# Patient Record
Sex: Male | Born: 1975 | Race: White | Hispanic: No | Smoking: Current every day smoker
Health system: Southern US, Community
[De-identification: ages and names within clinical notes are randomized; demographics above are authoritative.]

## PROBLEM LIST (undated history)

## (undated) DIAGNOSIS — R51 Headache: Secondary | ICD-10-CM

## (undated) DIAGNOSIS — M545 Low back pain, unspecified: Secondary | ICD-10-CM

## (undated) DIAGNOSIS — F329 Major depressive disorder, single episode, unspecified: Secondary | ICD-10-CM

## (undated) DIAGNOSIS — F429 Obsessive-compulsive disorder, unspecified: Secondary | ICD-10-CM

## (undated) DIAGNOSIS — F32A Depression, unspecified: Secondary | ICD-10-CM

## (undated) DIAGNOSIS — F1010 Alcohol abuse, uncomplicated: Secondary | ICD-10-CM

## (undated) HISTORY — DX: Headache: R51

## (undated) HISTORY — DX: Low back pain: M54.5

## (undated) HISTORY — DX: Low back pain, unspecified: M54.50

## (undated) HISTORY — DX: Depression, unspecified: F32.A

## (undated) HISTORY — DX: Obsessive-compulsive disorder, unspecified: F42.9

## (undated) HISTORY — DX: Major depressive disorder, single episode, unspecified: F32.9

---

## 2010-06-08 ENCOUNTER — Ambulatory Visit: Payer: Self-pay | Admitting: Internal Medicine

## 2010-06-08 ENCOUNTER — Encounter: Payer: Self-pay | Admitting: Family Medicine

## 2010-06-08 DIAGNOSIS — F1220 Cannabis dependence, uncomplicated: Secondary | ICD-10-CM

## 2010-06-08 DIAGNOSIS — F1010 Alcohol abuse, uncomplicated: Secondary | ICD-10-CM | POA: Insufficient documentation

## 2010-06-08 DIAGNOSIS — E663 Overweight: Secondary | ICD-10-CM | POA: Insufficient documentation

## 2010-06-08 DIAGNOSIS — S335XXA Sprain of ligaments of lumbar spine, initial encounter: Secondary | ICD-10-CM

## 2010-06-08 DIAGNOSIS — F17200 Nicotine dependence, unspecified, uncomplicated: Secondary | ICD-10-CM

## 2010-06-14 LAB — CONVERTED CEMR LAB
ALT: 31 units/L (ref 0–53)
CO2: 25 meq/L (ref 19–32)
Calcium: 10.3 mg/dL (ref 8.4–10.5)
Chloride: 105 meq/L (ref 96–112)
Cholesterol: 213 mg/dL — ABNORMAL HIGH (ref 0–200)
Creatinine, Ser: 1.01 mg/dL (ref 0.40–1.50)
Total CHOL/HDL Ratio: 4.8

## 2010-06-27 ENCOUNTER — Ambulatory Visit: Payer: Self-pay | Admitting: Internal Medicine
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
## 2010-10-24 ENCOUNTER — Encounter: Payer: Self-pay | Admitting: Family Medicine

## 2010-10-24 ENCOUNTER — Ambulatory Visit (INDEPENDENT_AMBULATORY_CARE_PROVIDER_SITE_OTHER): Payer: Managed Care, Other (non HMO) | Admitting: Family Medicine

## 2010-10-24 VITALS — BP 120/84 | HR 72 | Wt 222.8 lb

## 2010-10-24 DIAGNOSIS — M542 Cervicalgia: Secondary | ICD-10-CM

## 2010-10-24 DIAGNOSIS — S335XXA Sprain of ligaments of lumbar spine, initial encounter: Secondary | ICD-10-CM

## 2010-10-24 MED ORDER — CYCLOBENZAPRINE HCL 10 MG PO TABS: 10.0000 mg | ORAL_TABLET | Freq: Three times a day (TID) | ORAL | Status: DC | PRN

## 2010-10-24 MED ORDER — NAPROXEN 500 MG PO TABS: 500.0000 mg | ORAL_TABLET | Freq: Two times a day (BID) | ORAL | Status: DC
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
## 2010-10-24 NOTE — Assessment & Plan Note (Signed)
New. Most consistent with trapezius spasm. Will try flexeril and naprosyn. Rotator cuff tendinitis is a possibility but unlikely a tear because no known injury.

## 2010-12-05 ENCOUNTER — Emergency Department: Payer: Self-pay | Admitting: Emergency Medicine

## 2011-03-01 ENCOUNTER — Ambulatory Visit (INDEPENDENT_AMBULATORY_CARE_PROVIDER_SITE_OTHER): Payer: BC Managed Care – PPO | Admitting: Psychology

## 2011-03-01 DIAGNOSIS — F331 Major depressive disorder, recurrent, moderate: Secondary | ICD-10-CM

## 2011-03-01 DIAGNOSIS — F1010 Alcohol abuse, uncomplicated: Secondary | ICD-10-CM

## 2011-03-02 ENCOUNTER — Ambulatory Visit (INDEPENDENT_AMBULATORY_CARE_PROVIDER_SITE_OTHER): Payer: BC Managed Care – PPO | Admitting: Family Medicine

## 2011-03-02 ENCOUNTER — Encounter: Payer: Self-pay | Admitting: Family Medicine

## 2011-03-02 VITALS — BP 118/80 | HR 88 | Temp 98.4°F | Wt 221.8 lb

## 2011-03-02 DIAGNOSIS — F17200 Nicotine dependence, unspecified, uncomplicated: Secondary | ICD-10-CM

## 2011-03-02 DIAGNOSIS — F1220 Cannabis dependence, uncomplicated: Secondary | ICD-10-CM

## 2011-03-02 DIAGNOSIS — G4700 Insomnia, unspecified: Secondary | ICD-10-CM | POA: Insufficient documentation

## 2011-03-02 DIAGNOSIS — IMO0001 Reserved for inherently not codable concepts without codable children: Secondary | ICD-10-CM

## 2011-03-02 DIAGNOSIS — R52 Pain, unspecified: Secondary | ICD-10-CM

## 2011-03-02 DIAGNOSIS — F1010 Alcohol abuse, uncomplicated: Secondary | ICD-10-CM

## 2011-03-02 DIAGNOSIS — M791 Myalgia, unspecified site: Secondary | ICD-10-CM

## 2011-03-02 LAB — BASIC METABOLIC PANEL
CO2: 28 mEq/L (ref 19–32)
Calcium: 9.1 mg/dL (ref 8.4–10.5)
Chloride: 102 mEq/L (ref 96–112)
Glucose, Bld: 100 mg/dL — ABNORMAL HIGH (ref 70–99)
Potassium: 4.3 mEq/L (ref 3.5–5.1)
Sodium: 137 mEq/L (ref 135–145)

## 2011-03-02 MED ORDER — CYCLOBENZAPRINE HCL 10 MG PO TABS: 10.0000 mg | ORAL_TABLET | Freq: Two times a day (BID) | ORAL | Status: DC | PRN

## 2011-03-02 MED ORDER — TRAZODONE HCL 50 MG PO TABS: 50.0000 mg | ORAL_TABLET | Freq: Every day | ORAL | Status: AC

## 2011-03-02 NOTE — Assessment & Plan Note (Signed)
Check CK and K to rule out revesible causes. Anticipate component of body getting used to absence EtOH, treat with flexeril as needed.

## 2011-03-02 NOTE — Progress Notes (Signed)
  Subjective:    Patient ID: Cameron Torres, male    DOB: [birth year], 35 y.o.   MRN: [id number]  HPI CC: body aches  H/o depression, OCD.  Since last time I saw pt, he and wife had been having marital issues.  Had been seeing psych at [facility].  As of last Wednesday, stopped heavy drinking.  Was drinking case of beer then states stopping all drinking suddenly.  Thinks body was used to having EtOH to be relaxed, now feeling body aches all over.   Stopped drinking last Wednesday (>1 wk ago).    Saw Dr. Dellia Cloud yesterday - recommended we see pt today for further eval of soreness..   Feels "whole body in knot", back sore, neck sore, upper arms and legs sore.  Each day seems to get a bit worse.  Interferring with daily activities.  Denies fevers/chills.  Mild shakiness.  Sleep impaired, not getting restful sleep.  No palpitations, HA, gi upset or nausea.  Mood improved off substances.  No recent falls or injury.  No weakness or numbness.  Has tried alleve for 2 days, didn't help with soreness.  To start going to AA mtgs  Smoking - still <1ppd. Stopped MJ as well.  Review of Systems Per HPI    Objective:   Physical Exam  Nursing note and vitals reviewed. Constitutional: He appears well-developed and well-nourished. No distress.  Cardiovascular: Normal rate, regular rhythm, normal heart sounds and intact distal pulses.   No murmur heard. Pulmonary/Chest: Effort normal and breath sounds normal. No respiratory distress. He has no wheezes. He has no rales.  Musculoskeletal: Normal range of motion. He exhibits no edema.       Right shoulder: He exhibits normal range of motion.       Left shoulder: He exhibits normal range of motion.       Cervical back: He exhibits normal range of motion.       FROM neck and shoulders. No midline spine tenderness throughout spine. Some soreness to palpation upper arms, upper legs.  Skin: Skin is warm and dry. No rash noted.  Psychiatric: He has a normal  mood and affect.          Assessment & Plan:

## 2011-03-02 NOTE — Patient Instructions (Signed)
Trazodone 50mg  nightly to help sleep.  May increase to 2 pills nightly (100mg ) after 1 wk if not noticing improvement. Return if not improved after 2-3 wks. For muscle pain - trial of flexeril. Don't take flexeril and trazodone together.

## 2011-03-02 NOTE — Assessment & Plan Note (Signed)
Reports has quit.

## 2011-03-02 NOTE — Assessment & Plan Note (Signed)
Off EtOH, MJ.  Having trouble achieving restful sleep, sleep maintenance and initiation insomnia.  Trial of trazodone temporary then wean off.

## 2011-03-02 NOTE — Assessment & Plan Note (Addendum)
Reports has quit. To start attending AA. Congratulated. States sees improvement in relationship with wife.

## 2011-03-02 NOTE — Assessment & Plan Note (Signed)
Encouraged cessation.

## 2011-03-15 ENCOUNTER — Ambulatory Visit (INDEPENDENT_AMBULATORY_CARE_PROVIDER_SITE_OTHER): Payer: BC Managed Care – PPO | Admitting: Psychology

## 2011-03-15 DIAGNOSIS — F1010 Alcohol abuse, uncomplicated: Secondary | ICD-10-CM

## 2011-04-04 ENCOUNTER — Ambulatory Visit (INDEPENDENT_AMBULATORY_CARE_PROVIDER_SITE_OTHER): Payer: BC Managed Care – PPO | Admitting: Psychology

## 2011-04-04 DIAGNOSIS — F1010 Alcohol abuse, uncomplicated: Secondary | ICD-10-CM

## 2011-04-12 ENCOUNTER — Ambulatory Visit: Payer: BC Managed Care – PPO | Admitting: Psychology

## 2011-08-16 ENCOUNTER — Ambulatory Visit (INDEPENDENT_AMBULATORY_CARE_PROVIDER_SITE_OTHER): Payer: BC Managed Care – PPO | Admitting: Psychology

## 2011-08-16 DIAGNOSIS — F1010 Alcohol abuse, uncomplicated: Secondary | ICD-10-CM

## 2011-08-22 ENCOUNTER — Encounter: Payer: Self-pay | Admitting: Family Medicine

## 2011-08-22 ENCOUNTER — Ambulatory Visit (INDEPENDENT_AMBULATORY_CARE_PROVIDER_SITE_OTHER): Payer: BC Managed Care – PPO | Admitting: Family Medicine

## 2011-08-22 DIAGNOSIS — J329 Chronic sinusitis, unspecified: Secondary | ICD-10-CM | POA: Insufficient documentation

## 2011-08-22 DIAGNOSIS — H00029 Hordeolum internum unspecified eye, unspecified eyelid: Secondary | ICD-10-CM

## 2011-08-22 DIAGNOSIS — H00022 Hordeolum internum right lower eyelid: Secondary | ICD-10-CM | POA: Insufficient documentation

## 2011-08-22 DIAGNOSIS — Z639 Problem related to primary support group, unspecified: Secondary | ICD-10-CM | POA: Insufficient documentation

## 2011-08-22 MED ORDER — AMOXICILLIN-POT CLAVULANATE 875-125 MG PO TABS: 1.0000 | ORAL_TABLET | Freq: Two times a day (BID) | ORAL | Status: AC

## 2011-08-22 NOTE — Assessment & Plan Note (Signed)
Anticipate small hordeolum. Treat with warm compresses, artificial tears to ensure eyes stay hydrated.

## 2011-08-22 NOTE — Patient Instructions (Signed)
You have a sinus infection. Take medicine as prescribed: augmentin twice daily for 10 days. Push fluids and plenty of rest. Nasal saline irrigation or neti pot to help drain sinuses. May use simple mucinex with plenty of fluid to help mobilize mucous. for eyes, use warm compresses as well as artificial tears (like hypotears or rephresh).  If not improving let me know. For mood - I do want to wait on letter from Dr. Dellia Cloud  If you haven't heard from [place] this week, call him to make sure he's sent it. Let us know if fever >101.5, trouble opening/closing mouth, difficulty swallowing, or worsening - you may need to be seen again.

## 2011-08-22 NOTE — Assessment & Plan Note (Signed)
Anticipate acute sinusitis, given duration will treat as bacterial cause with course of augmentin. See pt instructions for plan. Update [place] if sxs not improving as expected.

## 2011-08-22 NOTE — Assessment & Plan Note (Addendum)
I do want to review letter from Dr. Dellia Cloud prior to prescribing benzos given h/o EtOH and MJ use. Will await letter. Advised to call psych if has not heard from [place] regarding treatment plan. Briefly discussed benzo use.

## 2011-08-22 NOTE — Progress Notes (Signed)
  Subjective:    Patient ID: Cameron Torres, male    DOB: 17-Aug-1975, 36 y.o.   MRN: [id number]  HPI CC: URI sxs  Sick for last several weeks.   Left work early last week, slept all day.  Felt better one day but since progressively worse.  Nasal congestion, ST in am, coughing spells throughout day, productive of green sputum.  Better after hot shower.  + HA described as sinus pressure.  Endorsing chills at home.  Staying queasy and with mild diarrhea.  Taking nyquil, mucinex.    No fevers, abd pain, vomiting, body aches, ear pain, tooth pain.    Did have flu shot 05/2012.  Also noticed bump R lower eyelid.  Irritating to the eye.    Continues to see Dr. Dellia Cloud.  Last saw on Tuesday.  He was to send me a letter but I have not received.  Feels gets angry, frustrated easily.  May be going through life change - with wife.  Prior was using EtOH to deal with this, but has been abstinent for last 7-8 months.  Thinks has been recommended start benzo by psychology.  Smoking at home.  Down to 1/2 ppd.  Children sick recently.  No h/o asthma.  Review of Systems Per HPI    Objective:   Physical Exam  Nursing note and vitals reviewed. Constitutional: He appears well-developed and well-nourished. No distress.  HENT:  Head: Normocephalic and atraumatic.  Right Ear: Hearing, tympanic membrane, external ear and ear canal normal.  Left Ear: Hearing, tympanic membrane, external ear and ear canal normal.  Nose: No mucosal edema or rhinorrhea. Right sinus exhibits maxillary sinus tenderness. Right sinus exhibits no frontal sinus tenderness. Left sinus exhibits maxillary sinus tenderness. Left sinus exhibits no frontal sinus tenderness.  Mouth/Throat: Uvula is midline and mucous membranes are normal. Posterior oropharyngeal edema and posterior oropharyngeal erythema present. No oropharyngeal exudate or tonsillar abscesses.       Sinus pressure Erythematous uvula  Eyes: EOM are normal. Pupils are  equal, round, and reactive to light. Right conjunctiva is not injected. Right conjunctiva has no hemorrhage. Left conjunctiva is not injected. Left conjunctiva has no hemorrhage. No scleral icterus.       R lower eyelid with tiny inflammed granuloma Bilaterally erythematous palpebral conjunctiva  Neck: Normal range of motion. Neck supple. No thyromegaly present.  Cardiovascular: Normal rate, regular rhythm, normal heart sounds and intact distal pulses.   No murmur heard. Pulmonary/Chest: Effort normal and breath sounds normal. No respiratory distress. He has no wheezes. He has no rales.  Lymphadenopathy:    He has no cervical adenopathy.  Skin: Skin is warm and dry. No rash noted.  Psychiatric: He has a normal mood and affect. His behavior is normal. Judgment and thought content normal.       Calm, good judgement      Assessment & Plan:

## 2011-08-25 ENCOUNTER — Telehealth: Payer: Self-pay | Admitting: Family Medicine

## 2011-08-25 MED ORDER — LORAZEPAM 1 MG PO TABS: 1.0000 mg | ORAL_TABLET | Freq: Two times a day (BID) | ORAL | Status: DC | PRN

## 2011-08-25 NOTE — Telephone Encounter (Signed)
Received letter from Dr. Dellia Cloud, asked to scan. Tried to touch base with Cameron Torres, left message. May start medicine to help with anxiety - want to discuss prn benzo vs better long term management strategy of SSRI like citalopram.  If pt invested in benzo, may try but will need to closely monitor. rtc 1 mo as previously scheduled.

## 2011-08-25 NOTE — Telephone Encounter (Signed)
Will do prn benzo.  Called in.  Discussed if worsening to return.  Discussed if needing higher dose of this will need to switch to another treatment option (SSRI).

## 2011-08-25 NOTE — Telephone Encounter (Signed)
Spoke with patient. He said he prefers to not have to depend on a medication daily due to his history, however he is aware of the addiction potential of prn benzos. He said that he doesn't have to deal with things that make him angry everyday or every week, but when he does, he would like something to help diffuse before he gets angry. I advised that you would more than likely call him back after hours and he said the number listed for him is the best number to reach him.

## 2011-09-04 ENCOUNTER — Ambulatory Visit: Payer: BC Managed Care – PPO | Admitting: Psychology

## 2011-12-04 ENCOUNTER — Encounter: Payer: Self-pay | Admitting: Family Medicine

## 2011-12-04 ENCOUNTER — Encounter: Payer: Self-pay | Admitting: *Deleted

## 2011-12-04 ENCOUNTER — Ambulatory Visit (INDEPENDENT_AMBULATORY_CARE_PROVIDER_SITE_OTHER): Payer: BC Managed Care – PPO | Admitting: Family Medicine

## 2011-12-04 VITALS — BP 118/82 | HR 84 | Temp 98.5°F | Wt 222.2 lb

## 2011-12-04 DIAGNOSIS — L237 Allergic contact dermatitis due to plants, except food: Secondary | ICD-10-CM | POA: Insufficient documentation

## 2011-12-04 DIAGNOSIS — L255 Unspecified contact dermatitis due to plants, except food: Secondary | ICD-10-CM

## 2011-12-04 MED ORDER — CYCLOBENZAPRINE HCL 10 MG PO TABS: 10.0000 mg | ORAL_TABLET | Freq: Two times a day (BID) | ORAL | Status: DC | PRN

## 2011-12-04 MED ORDER — DEXAMETHASONE SODIUM PHOSPHATE 10 MG/ML IJ SOLN
10.0000 mg | Freq: Once | INTRAMUSCULAR | Status: AC
  Administered 2011-12-04: 10 mg via INTRAMUSCULAR

## 2011-12-04 MED ORDER — PREDNISONE 20 MG PO TABS: ORAL_TABLET | ORAL | Status: DC

## 2011-12-04 MED ORDER — LORAZEPAM 1 MG PO TABS: 1.0000 mg | ORAL_TABLET | Freq: Two times a day (BID) | ORAL | Status: AC | PRN

## 2011-12-04 NOTE — Progress Notes (Signed)
Addended by: Josph Macho A on: 12/04/2011 10:46 AM   Modules accepted: Orders

## 2011-12-04 NOTE — Assessment & Plan Note (Signed)
Contact dermatitis to presumed poison ivy exposure on Friday. Given rapid spread, treat aggressively with shot of dexamethasone 10mg  IM and then treat with 10 d taper of prednisone. Update [place] if not improved after this.

## 2011-12-04 NOTE — Progress Notes (Signed)
  Subjective:    Patient ID: Cameron Torres, male    DOB: Apr 10, 1976, 36 y.o.   MRN: [id number]  HPI CC: poison ivy  Friday trimming hedges, then that night noticed rash on left forearm. Since then spreading, now on forehead, abdomen, right hand as well.  Noticing hands starting to swell and stiff from inflammation.  Very itchy.  No fevers/chills.  No new lotions or detergents, soaps, shampoos.  Denies recent sunburn.  Has used ivarest for itching.  H/o bad reaction to poison ivy  Requests refill of lorazepam and flexeril.  Review of Systems Per HPI    Objective:   Physical Exam  Nursing note and vitals reviewed. Constitutional: He appears well-developed and well-nourished. No distress.  Skin: Skin is warm and dry. Rash noted.       Diffuse pruritic papular rash on forearms as well as hands, some digit swelling present as well. Forehead erythematous and diffuse faint papular rash.  Some spreading down face. Right anterior abdomen with patch of erythematous papules  Psychiatric: He has a normal mood and affect.       Assessment & Plan:

## 2011-12-04 NOTE — Patient Instructions (Signed)
Steroid shot today. Take prednisone course as directed. May use oatmeal bath for itching as well as continuing anti itch cream. Can also take claritin or zyrtec for itch. Let us know if not improving as expected.  Poison [employer] ivy is a inflammation of the skin (contact dermatitis) caused by touching the allergens on the leaves of the ivy plant following previous exposure to the plant. The rash usually appears 48 hours after exposure. The rash is usually bumps (papules) or blisters (vesicles) in a linear pattern. Depending on your own sensitivity, the rash may simply cause redness and itching, or it may also progress to blisters which may break open. These must be well cared for to prevent secondary bacterial (germ) infection, followed by scarring. Keep any open areas dry, clean, dressed, and covered with an antibacterial ointment if needed. The eyes may also get puffy. The puffiness is worst in the morning and gets better as the day progresses. This dermatitis usually heals without scarring, within 2 to 3 weeks without treatment. HOME CARE INSTRUCTIONS  Thoroughly wash with soap and water as soon as you have been exposed to poison ivy. You have about one half hour to remove the plant resin before it will cause the rash. This washing will destroy the oil or antigen on the skin that is causing, or will cause, the rash. Be sure to wash under your fingernails as any plant resin there will continue to spread the rash. Do not rub skin vigorously when washing affected area. Poison ivy cannot spread if no oil from the plant remains on your body. A rash that has progressed to weeping sores will not spread the rash unless you have not washed thoroughly. It is also important to wash any clothes you have been wearing as these may carry active allergens. The rash will return if you wear the unwashed clothing, even several days later. Avoidance of the plant in the future is the best measure. Poison ivy plant can be  recognized by the number of leaves. Generally, poison ivy has three leaves with flowering branches on a single stem. Diphenhydramine may be purchased over the counter and used as needed for itching. Do not drive with this medication if it makes you drowsy.Ask your caregiver about medication for children. SEEK MEDICAL CARE IF:  Open sores develop.   Redness spreads beyond area of rash.   You notice purulent (pus-like) discharge.   You have increased pain.   Other signs of infection develop (such as fever).  Document Released: 06/30/2000 Document Revised: 06/22/2011 Document Reviewed: 05/19/2009 [facility] Patient Information 2012 [place], [place].

## 2012-11-19 ENCOUNTER — Encounter: Payer: Self-pay | Admitting: Family Medicine

## 2012-11-19 ENCOUNTER — Ambulatory Visit (INDEPENDENT_AMBULATORY_CARE_PROVIDER_SITE_OTHER): Payer: BC Managed Care – PPO | Admitting: Family Medicine

## 2012-11-19 VITALS — BP 138/92 | HR 88 | Temp 98.4°F | Wt 216.2 lb

## 2012-11-19 DIAGNOSIS — M62838 Other muscle spasm: Secondary | ICD-10-CM | POA: Insufficient documentation

## 2012-11-19 MED ORDER — CYCLOBENZAPRINE HCL 10 MG PO TABS: 10.0000 mg | ORAL_TABLET | Freq: Two times a day (BID) | ORAL | Status: AC | PRN

## 2012-11-19 MED ORDER — NAPROXEN 500 MG PO TABS: ORAL_TABLET | ORAL | Status: AC

## 2012-11-19 NOTE — Progress Notes (Signed)
  Subjective:    Patient ID: Cameron Torres, male    DOB: September 23, 1975, 37 y.o.   MRN: [id number]  HPI CC: neck pain  1 wk h/o Left neck into left shoulderblade pain.  Tolerable, did not take meds for this.  Then today acutely worse - feels like ice pick to left neck muscles.  Left work early today 2/2 pain.  Has been working overtime.  Not strenuous job.  today has tried 2 aleve which did not help.  Denies inciting trauma, falls, injury.   Denies radiculopathy down arms.  Occasional paresthesias of left hand when pain it's worse.  Past Medical History  Diagnosis Date  . Depression   . OCD (obsessive compulsive disorder)   . Headache   . LBP (low back pain)     Review of Systems Per HPI    Objective:   Physical Exam  Nursing note and vitals reviewed. Constitutional: He appears well-developed and well-nourished. No distress.  Musculoskeletal:  Midline cervical spine tenderness around C6/7 ++ evident left trapezius mm tightness as well as significant tenderness to palpation along entire muscle. Left shoulder raised at rest, left shoulderblade elevated as well compared to right FROM at shoulders and neck albeit elicits pain  Neurological: He has normal strength. No sensory deficit.  Reflex Scores:      Bicep reflexes are 1+ on the right side and 1+ on the left side.      Brachioradialis reflexes are 1+ on the right side and 1+ on the left side. 5/5 strength BUE Grip strength intact       Assessment & Plan:

## 2012-11-19 NOTE — Assessment & Plan Note (Signed)
Severe on left, anticipate trapezius spasm. He does have midline cervical neck pain today, however no other red flags on exam, nontraumatic, nonfocal neuro exam. Treat for now with flexeril and naprosyn. Update [place] if sxs persist for steroid course vs further evaluation. Pt agrees with plan.

## 2012-11-19 NOTE — Patient Instructions (Addendum)
You have severe muscle spasm of left neck muscles (trapezius). Treat with stretching exercises, ice/heat (whichever soothes better), and naprosyn anti inflammatory and flexeril muscle relaxant. If not improving with this ,let me know for steroid course. Work note provided today.

## 2016-01-18 ENCOUNTER — Encounter: Payer: Self-pay | Admitting: Emergency Medicine

## 2016-01-18 ENCOUNTER — Emergency Department
Admission: EM | Admit: 2016-01-18 | Discharge: 2016-01-18 | Disposition: A | Discharge status: Home / Self Care | Payer: Medicaid Other | Attending: Emergency Medicine | Admitting: Emergency Medicine

## 2016-01-18 DIAGNOSIS — F17200 Nicotine dependence, unspecified, uncomplicated: Secondary | ICD-10-CM | POA: Diagnosis not present

## 2016-01-18 DIAGNOSIS — Y999 Unspecified external cause status: Secondary | ICD-10-CM | POA: Insufficient documentation

## 2016-01-18 DIAGNOSIS — Y92009 Unspecified place in unspecified non-institutional (private) residence as the place of occurrence of the external cause: Secondary | ICD-10-CM | POA: Diagnosis not present

## 2016-01-18 DIAGNOSIS — F329 Major depressive disorder, single episode, unspecified: Secondary | ICD-10-CM | POA: Insufficient documentation

## 2016-01-18 DIAGNOSIS — Y9389 Activity, other specified: Secondary | ICD-10-CM | POA: Diagnosis not present

## 2016-01-18 DIAGNOSIS — S61402A Unspecified open wound of left hand, initial encounter: Secondary | ICD-10-CM | POA: Insufficient documentation

## 2016-01-18 DIAGNOSIS — W268XXA Contact with other sharp object(s), not elsewhere classified, initial encounter: Secondary | ICD-10-CM | POA: Insufficient documentation

## 2016-01-18 DIAGNOSIS — Z791 Long term (current) use of non-steroidal anti-inflammatories (NSAID): Secondary | ICD-10-CM | POA: Diagnosis not present

## 2016-01-18 MED ORDER — LIDOCAINE-EPINEPHRINE (PF) 1 %-1:200000 IJ SOLN
10.0000 mL | Freq: Once | INTRAMUSCULAR | Status: DC
  Filled 2016-01-18: qty 30

## 2016-01-18 NOTE — ED Notes (Signed)
Pt from urgent care after trying to take a wart off his hand. He states that he used fingernail clippers, screwdriver (for cauterizing). He tried to superglue it back. Pt denies alcohol use. Urgent care placed a tourniquet, which has slowed down the bleeding. Pt alert & oriented with NAD noted.

## 2016-01-18 NOTE — ED Provider Notes (Signed)
[facility] Emergency Department Provider Note  ____________________________________________  Time seen: Approximately 4:13 PM  I have reviewed the triage vital signs and the nursing notes.   HISTORY  Chief Complaint Extremity Laceration    HPI Cameron Torres is a 40 y.o. male who presents emergency department complaining of a puncture wound to the left hand. Patient states that he was trying to take a wart off at home with a finger nail clipper. Patient states that he quit to deep and began to have "spurting blood" coming out of his hand. Patient tried to stop bleeding with superglue as well as heating up the end of a screwdriver to cauterize the wound. He states that neither these options work. Patient went to urgent care and they stated that they were unable to treat this condition and sent him to the emergency department. Urgent care placed a IV tourniquet around the patient's wrist and advised patient to keep her pressure over the wound. Patient denies any lightheadedness, dizziness, nausea, vomiting, significant pain to the region.Tetanus is within 5 years.   Past Medical History  Diagnosis Date  . Depression   . OCD (obsessive compulsive disorder)   . Headache(784.0)   . LBP (low back pain)     Patient Active Problem List   Diagnosis Date Noted  . Muscle spasms of neck 11/19/2012  . Sinusitis 08/22/2011  . Hordeolum internum of right lower eyelid 08/22/2011  . Unspecified family circumstance 08/22/2011  . Body aches 03/02/2011  . Insomnia 03/02/2011  . OVERWEIGHT 06/08/2010  . CANNABIS ABUSE, EPISODIC 06/08/2010  . ALCOHOL USE 06/08/2010  . SMOKER 06/08/2010  . LUMBAR STRAIN 06/08/2010    History reviewed. No pertinent past surgical history.  Current Outpatient Rx  Name  Route  Sig  Dispense  Refill  . Multiple Vitamin (MULTIVITAMIN PO)   Oral   Take 1 tablet by mouth daily.           . naproxen (NAPROSYN) 500 MG tablet     Take one po bid x 1 week then prn pain, take with food   60 tablet   0     Allergies Review of patient's allergies indicates no known allergies.  Family History  Problem Relation Age of Onset  . Diabetes Father   . Bipolar disorder Father   . Cancer Maternal Grandfather   . Coronary artery disease Maternal Grandfather     Social History Social History  Substance Use Topics  . Smoking status: Current Every Day Smoker -- 0.50 packs/day  . Smokeless tobacco: None  . Alcohol Use: 0.0 oz/week     Comment: weekends     Review of Systems  Constitutional: No fever/chills Cardiovascular: no chest pain. Respiratory: no cough. No SOB. Musculoskeletal: Negative for musculoskeletal pain. Skin: Negative for rash, abrasions, lacerations, ecchymosis. Puncture to the left palm. Neurological: Negative for headaches, focal weakness or numbness. 10-point ROS otherwise negative.  ____________________________________________   PHYSICAL EXAM:  VITAL SIGNS: ED Triage Vitals  Enc Vitals Group     BP 01/18/16 1553 116/65 mmHg     Pulse Rate 01/18/16 1553 84     Resp 01/18/16 1553 20     Temp 01/18/16 1553 98.3 F (36.8 C)     Temp Source 01/18/16 1553 Oral     SpO2 01/18/16 1553 99 %     Weight 01/18/16 1553 190 lb (86.183 kg)     Height 01/18/16 1553 6\' 2"  (1.88 m)     Head Cir --  Peak Flow --      Pain Score --      Pain Loc --      Pain Edu? --      Excl. in GC? --      Constitutional: Alert and oriented. Well appearing and in no acute distress. Eyes: Conjunctivae are normal. PERRL. EOMI. Head: Atraumatic. Cardiovascular: Normal rate, regular rhythm. Normal S1 and S2.  Good peripheral circulation. Respiratory: Normal respiratory effort without tachypnea or retractions. Lungs CTAB. Good air entry to the bases with no decreased or absent breath sounds. Musculoskeletal: Full range of motion to all extremities. No gross deformities appreciated. Neurologic:  Normal speech  and language. No gross focal neurologic deficits are appreciated.  Skin:  Skin is warm, dry and intact. No rash noted.Small, bleeding puncture wound to the left palm. This is located approximately 3 cm proximal to the MCP joint of the second digit. Small puncture wound. Pulsating blood flow from area. Good range of motion to the finger as well as cap refill and sensation intact. Psychiatric: Mood and affect are normal. Speech and behavior are normal. Patient exhibits appropriate insight and judgement.   ____________________________________________   LABS (all labs ordered are listed, but only abnormal results are displayed)  Labs Reviewed - No data to display ____________________________________________  EKG   ____________________________________________  RADIOLOGY   No results found.  ____________________________________________    PROCEDURES  Procedure(s) performed:    LACERATION REPAIR Performed by: Racheal PatchesJonathan D Sybel Standish Authorized by: Delorise RoyalsJonathan D Erian Lariviere Consent: Verbal consent obtained. Risks and benefits: risks, benefits and alternatives were discussed Consent given by: patient Patient identity confirmed: provided demographic data Prepped and Draped in normal sterile fashion Wound explored  Puncture Location: Left palm  No Foreign Bodies seen or palpated  Anesthesia: local infiltration  Local anesthetic: lidocaine 1% with epinephrine  Anesthetic total: 5 ml  Irrigation method: syringe Amount of cleaning: standard  Technique: Area was anesthetized using lidocaine with epi. This considerably slowed down bleeding bleeding was still present. After thoroughly cleaning inspecting the area small visible arterial was identified. This was cauterized using electrocautery. All bleeding ceased. Wound was covered using Dermabond. Patient was observed for another 20 minutes with no return of bleeding. Pressure dressing is applied using gauze and Ace bandage. Wound care  instructions provided.   Patient tolerance: Patient tolerated the procedure well with no immediate complications.    Medications  lidocaine-EPINEPHrine (XYLOCAINE-EPINEPHrine) 1 %-1:200000 (PF) injection 10 mL (not administered)     ____________________________________________   INITIAL IMPRESSION / ASSESSMENT AND PLAN / ED COURSE  Pertinent labs & imaging results that were available during my care of the patient were reviewed by me and considered in my medical decision making (see chart for details).  Patient's diagnosis is consistent with puncture wound to the left palm. Area was initially bleeding in a pulsating matter. After injecting lidocaine with epi and cautery of the small superficial arteriole, all bleeding has ceased. Patient tolerated procedure well. Wound care structures are provided to patient. Patient's hands distress with the pressure dressing.. Patient is given ED precautions to return to the ED for any worsening or new symptoms.     ____________________________________________  FINAL CLINICAL IMPRESSION(S) / ED DIAGNOSES  Final diagnoses:  None      NEW MEDICATIONS STARTED DURING THIS VISIT:  New Prescriptions   No medications on file        This chart was dictated using voice recognition software/Dragon. Despite best efforts to proofread, errors can occur which can  change the meaning. Any change was purely unintentional.    Racheal Patches, PA-C 01/18/16 1649  Jeanmarie Plant, MD 01/18/16 [phone]

## 2016-01-18 NOTE — ED Notes (Signed)
See triage note. pt presents with tourniquet applied to right wrist  Small puncture type area noted to hand

## 2016-01-18 NOTE — Discharge Instructions (Signed)
Deep Skin Avulsion A deep skin avulsion is a type of open wound. It often results from a severe injury (trauma) that tears away all layers of the skin or an entire body part. The areas of the body that are most often affected by a deep skin avulsion include the face, lips, ears, nose, and fingers. A deep skin avulsion may make structures below the skin become visible. You may be able to see muscle, bone, nerves, and blood vessels. A deep skin avulsion can also damage important structures beneath the skin. These include tendons, ligaments, nerves, or blood vessels. CAUSES Injuries that often cause a deep skin avulsion include:  Being crushed.  Falling against a jagged surface.  Animal bites.  Gunshot wounds.  Severe burns.  Injuries that involve being dragged, such as bicycle or motorcycle accidents. SYMPTOMS Symptoms of a deep skin avulsion include:  Pain.  Numbness.  Swelling.  A misshapen body part.  Bleeding, which may be heavy.  Fluid leaking from the wound. DIAGNOSIS This condition may be diagnosed with a medical history and physical exam. You may also have X-rays done. TREATMENT The treatment that is chosen for a deep skin avulsion depends on how large and deep the wound is and where it is located. Treatment for all types of avulsions usually starts with:  Controlling the bleeding.  Washing out the wound with a germ-free (sterile) salt-water solution.  Removing dead tissue from the wound. A wound may be closed or left open to heal. This depends on the size and location of the wound and whether it is likely to become infected. Wounds are usually covered or closed if they expose blood vessels, nerves, bone, or cartilage.  Wounds that are small and clean may be closed with stitches (sutures).  Wounds that cannot be closed with sutures may be covered with a piece of skin (graft) or a skin flap. Skin may be taken from on or near the wound, from another part of the body,  or from a donor.  Wounds may be left open if they are hard to close or they may become infected. These wounds heal over time from the bottom up. You may also receive medicine. This may include:  Antibiotics.  A tetanus shot.  Rabies vaccine. HOME CARE INSTRUCTIONS Medicines  Take or apply over-the-counter and prescription medicines only as told by your health care provider.  If you were prescribed an antibiotic, take or apply it as told by your health care provider. Do not stop taking the antibiotic even if your condition improves.  You may get anti-itch medicine while your wound is healing. Use it only as told by your health care provider. Wound Care  There are many ways to close and cover a wound. For example, a wound can be covered with sutures, skin glue, or adhesive strips. Follow instructions from your health care provider about:  How to take care of your wound.  When and how you should change your bandage (dressing).  When you should remove your dressing.  Removing whatever was used to close your wound.  Keep the dressing dry as told by your health care provider. Do not take baths, swim, use a hot tub, or do anything that would put your wound underwater until your health care provider approves.  Clean the wound each day or as told by your health care provider.  Wash the wound with mild soap and water.  Rinse the wound with water to remove all soap.  Pat the wound  dry with a clean towel. Do not rub it.  Do not scratch or pick at the wound.  Check your wound every day for signs of infection. Watch for:  Redness, swelling, or pain.  Fluid, blood, or pus. General Instructions  Raise (elevate) the injured area above the level of your heart while you are sitting or lying down.  Keep all follow-up visits as told by your health care provider. This is important. SEEK MEDICAL CARE IF:  You received a tetanus shot and you have swelling, severe pain, redness, or  bleeding at the injection site.  You have a fever.  Your pain is not controlled with medicine.  You have increased redness, swelling, or pain at the site of your wound.  You have fluid, blood, or pus coming from your wound.  You notice a bad smell coming from your wound or your dressing.  A wound that was closed breaks open.  You notice something coming out of the wound, such as wood or glass.  You notice a change in the color of your skin near your wound.  You develop a new rash.  You need to change the dressing frequently due to fluid, blood, or pus draining from the wound. SEEK IMMEDIATE MEDICAL CARE IF:  Your pain suddenly increases and is severe.  You develop severe swelling around the wound.  You develop numbness around the wound.  You have nausea and vomiting that does not go away after 24 hours.  You feel light-headed, weak, or faint.  You develop chest pain.  You have trouble breathing.  Your wound is on your hand or foot and you cannot properly move a finger or toe.  The wound is on your hand or foot and you notice that your fingers or toes look pale or bluish.  You have a red streak going away from your wound.   This information is not intended to replace advice given to you by your health care provider. Make sure you discuss any questions you have with your health care provider.   Document Released: 08/29/2006 Document Revised: 11/17/2014 Document Reviewed: 07/08/2014 Elsevier Interactive Patient Education 2016 [employer]  Wound Care Taking care of your wound properly can help to prevent pain and infection. It can also help your wound to heal more quickly.  HOW TO CARE FOR YOUR WOUND  Take or apply over-the-counter and prescription medicines only as told by your health care provider.  If you were prescribed antibiotic medicine, take or apply it as told by your health care provider. Do not stop using the antibiotic even if your condition  improves.  Clean the wound each day or as told by your health care provider.  Wash the wound with mild soap and water.  Rinse the wound with water to remove all soap.  Pat the wound dry with a clean towel. Do not rub it.  There are many different ways to close and cover a wound. For example, a wound can be covered with stitches (sutures), skin glue, or adhesive strips. Follow instructions from your health care provider about:  How to take care of your wound.  When and how you should change your bandage (dressing).  When you should remove your dressing.  Removing whatever was used to close your wound.  Check your wound every day for signs of infection. Watch for:  Redness, swelling, or pain.  Fluid, blood, or pus.  Keep the dressing dry until your health care provider says it can be removed.  Do not take baths, swim, use a hot tub, or do anything that would put your wound underwater until your health care provider approves.  Raise (elevate) the injured area above the level of your heart while you are sitting or lying down.  Do not scratch or pick at the wound.  Keep all follow-up visits as told by your health care provider. This is important. SEEK MEDICAL CARE IF:  You received a tetanus shot and you have swelling, severe pain, redness, or bleeding at the injection site.  You have a fever.  Your pain is not controlled with medicine.  You have increased redness, swelling, or pain at the site of your wound.  You have fluid, blood, or pus coming from your wound.  You notice a bad smell coming from your wound or your dressing. SEEK IMMEDIATE MEDICAL CARE IF:  You have a red streak going away from your wound.   This information is not intended to replace advice given to you by your health care provider. Make sure you discuss any questions you have with your health care provider.   Document Released: 04/11/2008 Document Revised: 11/17/2014 Document Reviewed:  06/29/2014 Elsevier Interactive Patient Education [employer]

## 2016-01-30 ENCOUNTER — Encounter: Payer: Self-pay | Admitting: Emergency Medicine

## 2016-01-30 ENCOUNTER — Emergency Department: Payer: Medicaid Other

## 2016-01-30 ENCOUNTER — Inpatient Hospital Stay
Admission: EM | Admit: 2016-01-30 | Discharge: 2016-02-15 | DRG: 432 | Disposition: E | Payer: Medicaid Other | Attending: Internal Medicine | Admitting: Internal Medicine

## 2016-01-30 DIAGNOSIS — I472 Ventricular tachycardia: Secondary | ICD-10-CM | POA: Diagnosis not present

## 2016-01-30 DIAGNOSIS — D62 Acute posthemorrhagic anemia: Secondary | ICD-10-CM | POA: Diagnosis present

## 2016-01-30 DIAGNOSIS — J690 Pneumonitis due to inhalation of food and vomit: Secondary | ICD-10-CM | POA: Diagnosis present

## 2016-01-30 DIAGNOSIS — Z833 Family history of diabetes mellitus: Secondary | ICD-10-CM

## 2016-01-30 DIAGNOSIS — K7290 Hepatic failure, unspecified without coma: Secondary | ICD-10-CM | POA: Diagnosis present

## 2016-01-30 DIAGNOSIS — F17210 Nicotine dependence, cigarettes, uncomplicated: Secondary | ICD-10-CM | POA: Diagnosis present

## 2016-01-30 DIAGNOSIS — J9690 Respiratory failure, unspecified, unspecified whether with hypoxia or hypercapnia: Secondary | ICD-10-CM

## 2016-01-30 DIAGNOSIS — R059 Cough, unspecified: Secondary | ICD-10-CM

## 2016-01-30 DIAGNOSIS — J189 Pneumonia, unspecified organism: Secondary | ICD-10-CM | POA: Diagnosis present

## 2016-01-30 DIAGNOSIS — Z66 Do not resuscitate: Secondary | ICD-10-CM | POA: Diagnosis not present

## 2016-01-30 DIAGNOSIS — D684 Acquired coagulation factor deficiency: Secondary | ICD-10-CM | POA: Diagnosis present

## 2016-01-30 DIAGNOSIS — K766 Portal hypertension: Secondary | ICD-10-CM | POA: Insufficient documentation

## 2016-01-30 DIAGNOSIS — E871 Hypo-osmolality and hyponatremia: Secondary | ICD-10-CM | POA: Diagnosis present

## 2016-01-30 DIAGNOSIS — E876 Hypokalemia: Secondary | ICD-10-CM | POA: Diagnosis present

## 2016-01-30 DIAGNOSIS — K2970 Gastritis, unspecified, without bleeding: Secondary | ICD-10-CM | POA: Insufficient documentation

## 2016-01-30 DIAGNOSIS — D6959 Other secondary thrombocytopenia: Secondary | ICD-10-CM | POA: Diagnosis present

## 2016-01-30 DIAGNOSIS — E870 Hyperosmolality and hypernatremia: Secondary | ICD-10-CM | POA: Diagnosis not present

## 2016-01-30 DIAGNOSIS — J9600 Acute respiratory failure, unspecified whether with hypoxia or hypercapnia: Secondary | ICD-10-CM

## 2016-01-30 DIAGNOSIS — R112 Nausea with vomiting, unspecified: Secondary | ICD-10-CM | POA: Diagnosis present

## 2016-01-30 DIAGNOSIS — K209 Esophagitis, unspecified without bleeding: Secondary | ICD-10-CM | POA: Insufficient documentation

## 2016-01-30 DIAGNOSIS — D539 Nutritional anemia, unspecified: Secondary | ICD-10-CM | POA: Diagnosis present

## 2016-01-30 DIAGNOSIS — F10231 Alcohol dependence with withdrawal delirium: Secondary | ICD-10-CM | POA: Diagnosis not present

## 2016-01-30 DIAGNOSIS — Z515 Encounter for palliative care: Secondary | ICD-10-CM | POA: Diagnosis present

## 2016-01-30 DIAGNOSIS — K7200 Acute and subacute hepatic failure without coma: Secondary | ICD-10-CM

## 2016-01-30 DIAGNOSIS — R0600 Dyspnea, unspecified: Secondary | ICD-10-CM

## 2016-01-30 DIAGNOSIS — G9341 Metabolic encephalopathy: Secondary | ICD-10-CM | POA: Diagnosis not present

## 2016-01-30 DIAGNOSIS — K922 Gastrointestinal hemorrhage, unspecified: Secondary | ICD-10-CM | POA: Diagnosis present

## 2016-01-30 DIAGNOSIS — I864 Gastric varices: Secondary | ICD-10-CM | POA: Diagnosis present

## 2016-01-30 DIAGNOSIS — K7031 Alcoholic cirrhosis of liver with ascites: Secondary | ICD-10-CM | POA: Diagnosis present

## 2016-01-30 DIAGNOSIS — R05 Cough: Secondary | ICD-10-CM

## 2016-01-30 DIAGNOSIS — R600 Localized edema: Secondary | ICD-10-CM | POA: Diagnosis present

## 2016-01-30 DIAGNOSIS — J441 Chronic obstructive pulmonary disease with (acute) exacerbation: Secondary | ICD-10-CM | POA: Diagnosis not present

## 2016-01-30 DIAGNOSIS — D649 Anemia, unspecified: Secondary | ICD-10-CM

## 2016-01-30 DIAGNOSIS — A419 Sepsis, unspecified organism: Secondary | ICD-10-CM | POA: Diagnosis not present

## 2016-01-30 DIAGNOSIS — E875 Hyperkalemia: Secondary | ICD-10-CM | POA: Diagnosis not present

## 2016-01-30 DIAGNOSIS — K3189 Other diseases of stomach and duodenum: Secondary | ICD-10-CM | POA: Diagnosis present

## 2016-01-30 DIAGNOSIS — R6520 Severe sepsis without septic shock: Secondary | ICD-10-CM | POA: Diagnosis not present

## 2016-01-30 DIAGNOSIS — R34 Anuria and oliguria: Secondary | ICD-10-CM | POA: Diagnosis not present

## 2016-01-30 DIAGNOSIS — I959 Hypotension, unspecified: Secondary | ICD-10-CM | POA: Diagnosis present

## 2016-01-30 DIAGNOSIS — K7011 Alcoholic hepatitis with ascites: Secondary | ICD-10-CM | POA: Diagnosis present

## 2016-01-30 DIAGNOSIS — Z4659 Encounter for fitting and adjustment of other gastrointestinal appliance and device: Secondary | ICD-10-CM

## 2016-01-30 DIAGNOSIS — Z01818 Encounter for other preprocedural examination: Secondary | ICD-10-CM

## 2016-01-30 DIAGNOSIS — J80 Acute respiratory distress syndrome: Secondary | ICD-10-CM | POA: Diagnosis not present

## 2016-01-30 DIAGNOSIS — R739 Hyperglycemia, unspecified: Secondary | ICD-10-CM | POA: Diagnosis not present

## 2016-01-30 DIAGNOSIS — Z452 Encounter for adjustment and management of vascular access device: Secondary | ICD-10-CM | POA: Insufficient documentation

## 2016-01-30 DIAGNOSIS — Z8249 Family history of ischemic heart disease and other diseases of the circulatory system: Secondary | ICD-10-CM

## 2016-01-30 HISTORY — DX: Alcohol abuse, uncomplicated: F10.10

## 2016-01-30 LAB — COMPREHENSIVE METABOLIC PANEL
ALK PHOS: 75 U/L (ref 38–126)
ALT: 34 U/L (ref 17–63)
AST: 129 U/L — ABNORMAL HIGH (ref 15–41)
Albumin: 3.2 g/dL — ABNORMAL LOW (ref 3.5–5.0)
Anion gap: 15 (ref 5–15)
BUN: 24 mg/dL — AB (ref 6–20)
CALCIUM: 9 mg/dL (ref 8.9–10.3)
CO2: 26 mmol/L (ref 22–32)
CREATININE: 0.87 mg/dL (ref 0.61–1.24)
Chloride: 86 mmol/L — ABNORMAL LOW (ref 101–111)
Glucose, Bld: 171 mg/dL — ABNORMAL HIGH (ref 65–99)
Potassium: 2.6 mmol/L — CL (ref 3.5–5.1)
SODIUM: 127 mmol/L — AB (ref 135–145)
Total Bilirubin: 15.2 mg/dL — ABNORMAL HIGH (ref 0.3–1.2)
Total Protein: 8.6 g/dL — ABNORMAL HIGH (ref 6.5–8.1)

## 2016-01-30 LAB — LIPASE, BLOOD: Lipase: 32 U/L (ref 11–51)

## 2016-01-30 LAB — ABO/RH: ABO/RH(D): O POS

## 2016-01-30 LAB — CBC WITH DIFFERENTIAL/PLATELET
BAND NEUTROPHILS: 2 %
BASOS PCT: 1 %
BLASTS: 0 %
Basophils Absolute: 0.1 10*3/uL (ref 0–0.1)
EOS ABS: 0.1 10*3/uL (ref 0–0.7)
EOS PCT: 1 %
HEMATOCRIT: 18.3 % — AB (ref 40.0–52.0)
Hemoglobin: 6.5 g/dL — ABNORMAL LOW (ref 13.0–18.0)
LYMPHS ABS: 1.4 10*3/uL (ref 1.0–3.6)
LYMPHS PCT: 11 %
MCH: 41.6 pg — ABNORMAL HIGH (ref 26.0–34.0)
MCHC: 35.2 g/dL (ref 32.0–36.0)
MCV: 118.3 fL — ABNORMAL HIGH (ref 80.0–100.0)
METAMYELOCYTES PCT: 0 %
MONO ABS: 0.4 10*3/uL (ref 0.2–1.0)
MONOS PCT: 3 %
Myelocytes: 1 %
NEUTROS ABS: 10.4 10*3/uL — AB (ref 1.4–6.5)
Neutrophils Relative %: 81 %
OTHER: 0 %
PLATELETS: 79 10*3/uL — AB (ref 150–440)
Promyelocytes Absolute: 0 %
RBC: 1.55 MIL/uL — ABNORMAL LOW (ref 4.40–5.90)
RDW: 14.8 % — AB (ref 11.5–14.5)
WBC: 12.4 10*3/uL — ABNORMAL HIGH (ref 3.8–10.6)
nRBC: 0 /100 WBC

## 2016-01-30 LAB — URINALYSIS COMPLETE WITH MICROSCOPIC (ARMC ONLY)
BACTERIA UA: NONE SEEN
Glucose, UA: NEGATIVE mg/dL
HGB URINE DIPSTICK: NEGATIVE
KETONES UR: NEGATIVE mg/dL
Leukocytes, UA: NEGATIVE
NITRITE: NEGATIVE
PH: 5 (ref 5.0–8.0)
PROTEIN: 30 mg/dL — AB
SPECIFIC GRAVITY, URINE: 1.018 (ref 1.005–1.030)
Squamous Epithelial / LPF: NONE SEEN

## 2016-01-30 LAB — BILIRUBIN, DIRECT: BILIRUBIN DIRECT: 3.7 mg/dL — AB (ref 0.1–0.5)

## 2016-01-30 LAB — PROTIME-INR
INR: 1.92
Prothrombin Time: 21.9 seconds — ABNORMAL HIGH (ref 11.4–15.0)

## 2016-01-30 LAB — MAGNESIUM: Magnesium: 1.5 mg/dL — ABNORMAL LOW (ref 1.7–2.4)

## 2016-01-30 LAB — PREPARE RBC (CROSSMATCH)

## 2016-01-30 LAB — ACETAMINOPHEN LEVEL: Acetaminophen (Tylenol), Serum: <10 ug/mL — ABNORMAL LOW (ref 10–30)

## 2016-01-30 MED ORDER — ONDANSETRON HCL 4 MG PO TABS: 4.0000 mg | ORAL_TABLET | Freq: Four times a day (QID) | ORAL | Status: DC | PRN

## 2016-01-30 MED ORDER — IOPAMIDOL (ISOVUE-300) INJECTION 61%
100.0000 mL | Freq: Once | INTRAVENOUS | Status: AC | PRN
  Administered 2016-01-30: 100 mL via INTRAVENOUS

## 2016-01-30 MED ORDER — LEVOFLOXACIN IN D5W 500 MG/100ML IV SOLN
500.0000 mg | INTRAVENOUS | Status: DC
  Administered 2016-01-31: 19:00:00 500 mg via INTRAVENOUS
  Filled 2016-01-30 (×2): qty 100

## 2016-01-30 MED ORDER — ACETAMINOPHEN 325 MG PO TABS: 650.0000 mg | ORAL_TABLET | Freq: Four times a day (QID) | ORAL | Status: DC | PRN

## 2016-01-30 MED ORDER — PNEUMOCOCCAL VAC POLYVALENT 25 MCG/0.5ML IJ INJ
0.5000 mL | INJECTION | INTRAMUSCULAR | Status: AC
  Administered 2016-01-31: 0.5 mL via INTRAMUSCULAR
  Filled 2016-01-30: qty 0.5

## 2016-01-30 MED ORDER — PREDNISONE 20 MG PO TABS
40.0000 mg | ORAL_TABLET | Freq: Every day | ORAL | Status: DC
  Administered 2016-01-31 – 2016-02-01 (×2): 40 mg via ORAL
  Filled 2016-01-30 (×2): qty 2

## 2016-01-30 MED ORDER — SODIUM CHLORIDE 0.9 % IV BOLUS (SEPSIS)
1000.0000 mL | Freq: Once | INTRAVENOUS | Status: AC
  Administered 2016-01-30: 1000 mL via INTRAVENOUS

## 2016-01-30 MED ORDER — PANTOPRAZOLE SODIUM 40 MG PO TBEC
40.0000 mg | DELAYED_RELEASE_TABLET | Freq: Two times a day (BID) | ORAL | Status: DC
  Administered 2016-01-30 – 2016-02-01 (×4): 40 mg via ORAL
  Filled 2016-01-30 (×4): qty 1

## 2016-01-30 MED ORDER — SODIUM CHLORIDE 0.9 % IV SOLN
10.0000 mL/h | Freq: Once | INTRAVENOUS | Status: AC
  Administered 2016-01-30: 22:00:00 10 mL/h via INTRAVENOUS

## 2016-01-30 MED ORDER — LEVOFLOXACIN IN D5W 750 MG/150ML IV SOLN: 750.0000 mg | Freq: Once | INTRAVENOUS | Status: DC

## 2016-01-30 MED ORDER — LORATADINE 10 MG PO TABS
10.0000 mg | ORAL_TABLET | Freq: Every day | ORAL | Status: DC
  Administered 2016-01-30 – 2016-02-01 (×3): 10 mg via ORAL
  Filled 2016-01-30 (×3): qty 1

## 2016-01-30 MED ORDER — LEVOFLOXACIN IN D5W 500 MG/100ML IV SOLN
500.0000 mg | Freq: Once | INTRAVENOUS | Status: AC
  Administered 2016-01-31: 01:00:00 500 mg via INTRAVENOUS
  Filled 2016-01-30: qty 100

## 2016-01-30 MED ORDER — ONDANSETRON HCL 4 MG/2ML IJ SOLN: 4.0000 mg | Freq: Once | INTRAMUSCULAR | Status: DC

## 2016-01-30 MED ORDER — POTASSIUM CHLORIDE IN NACL 20-0.9 MEQ/L-% IV SOLN
INTRAVENOUS | Status: DC
  Administered 2016-01-31 (×2): via INTRAVENOUS
  Filled 2016-01-30 (×6): qty 1000

## 2016-01-30 MED ORDER — ONDANSETRON HCL 4 MG/2ML IJ SOLN
4.0000 mg | Freq: Four times a day (QID) | INTRAMUSCULAR | Status: DC | PRN
Start: 2016-01-30 — End: 2016-02-09

## 2016-01-30 MED ORDER — ACETAMINOPHEN 650 MG RE SUPP: 650.0000 mg | Freq: Four times a day (QID) | RECTAL | Status: DC | PRN

## 2016-01-30 MED ORDER — POTASSIUM CHLORIDE CRYS ER 20 MEQ PO TBCR
20.0000 meq | EXTENDED_RELEASE_TABLET | Freq: Two times a day (BID) | ORAL | Status: DC
  Administered 2016-01-30 – 2016-01-31 (×2): 20 meq via ORAL
  Filled 2016-01-30 (×2): qty 1

## 2016-01-30 MED ORDER — IPRATROPIUM-ALBUTEROL 0.5-2.5 (3) MG/3ML IN SOLN
3.0000 mL | RESPIRATORY_TRACT | Status: DC | PRN
Start: 2016-01-30 — End: 2016-02-01
  Filled 2016-01-30: qty 3

## 2016-01-30 NOTE — ED Notes (Signed)
Vomiting since Thursday.  Says no diarrhea, he was constipated and then took linsess and had large amt stool yesterday.  Says he vomits after drinking water.

## 2016-01-30 NOTE — ED Provider Notes (Signed)
[facility] Emergency Department Provider Note  Time seen: 3:11 PM  I have reviewed the triage vital signs and the nursing notes.   HISTORY  Chief Complaint Emesis    HPI Cameron Torres is a 40 y.o. male with no past medical history who presents the emergency department with nausea and vomiting. According to the patient he was feeling very constipated 2 weeks ago, he took medication 1 week ago had several large bowel movements however since that time he has been feeling very nauseated with daily vomiting. Patient denies any abdominal pain, fever. Patient states he can keep small amounts of fluids down but anytime he tries to drink a large amount of fluid or eating anything he vomits.     Past Medical History  Diagnosis Date  . Depression   . OCD (obsessive compulsive disorder)   . Headache(784.0)   . LBP (low back pain)     Patient Active Problem List   Diagnosis Date Noted  . Muscle spasms of neck 11/19/2012  . Sinusitis 08/22/2011  . Hordeolum internum of right lower eyelid 08/22/2011  . Unspecified family circumstance 08/22/2011  . Body aches 03/02/2011  . Insomnia 03/02/2011  . OVERWEIGHT 06/08/2010  . CANNABIS ABUSE, EPISODIC 06/08/2010  . ALCOHOL USE 06/08/2010  . SMOKER 06/08/2010  . LUMBAR STRAIN 06/08/2010    No past surgical history on file.  Current Outpatient Rx  Name  Route  Sig  Dispense  Refill  . Multiple Vitamin (MULTIVITAMIN PO)   Oral   Take 1 tablet by mouth daily.           . naproxen (NAPROSYN) 500 MG tablet      Take one po bid x 1 week then prn pain, take with food   60 tablet   0     Allergies Review of patient's allergies indicates no known allergies.  Family History  Problem Relation Age of Onset  . Diabetes Father   . Bipolar disorder Father   . Cancer Maternal Grandfather   . Coronary artery disease Maternal Grandfather     Social History Social History  Substance Use Topics  . Smoking  status: Current Every Day Smoker -- 0.50 packs/day  . Smokeless tobacco: None  . Alcohol Use: 0.0 oz/week     Comment: weekends    Review of Systems Constitutional: Negative for fever. Cardiovascular: Negative for chest pain. Respiratory: Negative for shortness of breath. Gastrointestinal: Negative for abdominal pain. Positive for nausea and vomiting. Genitourinary: Negative for dysuria. States dark urine. Neurological: Negative for headache 10-point ROS otherwise negative.  ____________________________________________   PHYSICAL EXAM:  VITAL SIGNS: ED Triage Vitals  Enc Vitals Group     BP 02/02/2016 1433 132/45 mmHg     Pulse Rate 02/05/2016 1433 100     Resp 01/15/2016 1433 16     Temp 01/18/2016 1433 99 F (37.2 C)     Temp Source 01/29/2016 1433 Oral     SpO2 01/25/2016 1433 100 %     Weight 01/25/2016 1433 180 lb (81.647 kg)     Height 01/29/2016 1433 6\' 2"  (1.88 m)     Head Cir --      Peak Flow --      Pain Score 01/26/2016 1434 3     Pain Loc --      Pain Edu? --      Excl. in GC? --     Constitutional: Alert and oriented. Well appearing and in no distress. Eyes:  Scleral icterus on exam. ENT   Head: Normocephalic and atraumatic.   Mouth/Throat: Dry mucous membranes Cardiovascular: Normal rate, regular rhythm. No murmur Respiratory: Normal respiratory effort without tachypnea nor retractions. Breath sounds are clear  Gastrointestinal: Soft and nontender. No distention.  Dull percussion consistent with mild possible ascitic fluid.Rectal exam shows light brown stool slightly guaiac positive. Musculoskeletal: Nontender with normal range of motion in all extremities.  Neurologic:  Normal speech and language. No gross focal neurologic deficits Skin:  Skin is warm, dry and intact.  Psychiatric: Mood and affect are normal.   ____________________________________________    INITIAL IMPRESSION / ASSESSMENT AND PLAN / ED COURSE  Pertinent labs & imaging results that were  available during my care of the patient were reviewed by me and considered in my medical decision making (see chart for details).  The patient presents the emergency department nausea and vomiting for the past one week. On exam patient appears to have scleral icterus. In discussing this with the patient, he states he has never noticed this before. He does admit to drinking 3-4 beers per day but states he is not drinking any alcohol the past one week. Denies any abdominal pain, has a nontender abdominal exam although it is consistent with mild amounts of fluid with dull percussion, possible ascitic fluid. Given the patient's scleral icterus, nausea and vomiting my concern at this point would be for hepatic insufficiency.  We will check labs, though Zofran, IV fluids and closely monitor in the emergency department. We'll also obtain a right upper quadrant ultrasound to further evaluate.  Patient's labs have resulted showing a significantly elevated bilirubin of 15, AST is elevated, INR 1.94, sodium 127 with a hemoglobin of 6.5. Rectal exam shows light brown stool slightly guaiac positive. Recent denies any dark vomit. Patient appears to be in any degree of liver failure, his smelled score 29 puts him at a 20% 3 month mortality. Currently awaiting ultrasound results. Patient may require care of a tertiary care center, we'll discuss this with our medical admission team once the ultrasound results are known.  I ordered a blood transfusion for the patient. CT scan shows evidence of groundglass opacities on x-ray. We will treat with antibiotics. Patient's labs are consistent with liver failure. I discussed the patient with the hospitalist, we have GI coverage tomorrow, they feel comfortable admitting the patient locally. Patient is agreeable to this plan.   CRITICAL CARE Performed by: Minna Antis   Total critical care time: 60 minutes  Critical care time was exclusive of separately billable  procedures and treating other patients.  Critical care was necessary to treat or prevent imminent or life-threatening deterioration.  Critical care was time spent personally by me on the following activities: development of treatment plan with patient and/or surrogate as well as nursing, discussions with consultants, evaluation of patient's response to treatment, examination of patient, obtaining history from patient or surrogate, ordering and performing treatments and interventions, ordering and review of laboratory studies, ordering and review of radiographic studies, pulse oximetry and re-evaluation of patient's condition.   ____________________________________________   FINAL CLINICAL IMPRESSION(S) / ED DIAGNOSES  Nausea and vomiting Symptomatic anemia Liver failure  Minna Antis, MD 02/08/2016 1858

## 2016-01-30 NOTE — H&P (Signed)
[employer] - [place] [facility]   PATIENT NAME: Cameron Torres    MR#:  [id number]  DATE OF BIRTH:  [birth year]  DATE OF ADMISSION:  04-28-2016  PRIMARY CARE PHYSICIAN: Eustaquio BoydenJavier Gutierrez, MD   REQUESTING/REFERRING PHYSICIAN: Dr. Minna AntisKevin Paduchowski  CHIEF COMPLAINT:   Chief Complaint  Patient presents with  . Emesis    HISTORY OF PRESENT ILLNESS:  Cameron Torres  is a 40 y.o. male with a known history of Obsessive-compulsive disorder, depression, alcohol abuse who presents to the hospital due to intractable nausea and vomiting. Patient says that he has not been able to keep anything down now for the past 2-3 days. He does have a history of alcohol abuse but no history of liver cirrhosis that has been diagnosed. He said he was feeling increasingly weak and could not keep anything down and therefore came to the ER for further evaluation. He also denies any abdominal pain, chest pain, shortness of breath, fever, chills presently. He says that he has not drank any alcohol now since the past 3 days since she's been sick. He presented to the emergency room was noted to be anemic with hemoglobin of 6.5, and also noted to be jaundiced and to have alcoholic hepatitis. Hospitalist services were contacted further treatment and evaluation.  PAST MEDICAL HISTORY:   Past Medical History  Diagnosis Date  . Depression   . OCD (obsessive compulsive disorder)   . Headache(784.0)   . LBP (low back pain)   . Alcohol abuse     PAST SURGICAL HISTORY:  No past surgical history on file.  SOCIAL HISTORY:   Social History  Substance Use Topics  . Smoking status: Current Every Day Smoker -- 0.50 packs/day for 30 years  . Smokeless tobacco: Not on file  . Alcohol Use: 0.0 oz/week    0 Standard drinks or equivalent per week     Comment: 12 pack daily for years.  Now intermittent drinking.  Mixed Drinks at times.     FAMILY HISTORY:   Family History  Problem Relation Age of  Onset  . Diabetes Father   . Bipolar disorder Father   . Cancer Maternal Grandfather   . Coronary artery disease Maternal Grandfather     DRUG ALLERGIES:  No Known Allergies  REVIEW OF SYSTEMS:   Review of Systems  Constitutional: Negative for fever and weight loss.  HENT: Negative for congestion, nosebleeds and tinnitus.   Eyes: Negative for blurred vision, double vision and redness.  Respiratory: Negative for cough, hemoptysis and shortness of breath.   Cardiovascular: Negative for chest pain, orthopnea, leg swelling and PND.  Gastrointestinal: Positive for nausea and vomiting. Negative for abdominal pain, diarrhea and melena.  Genitourinary: Negative for dysuria, urgency and hematuria.  Musculoskeletal: Negative for joint pain and falls.  Neurological: Negative for dizziness, tingling, sensory change, focal weakness, seizures, weakness and headaches.  Endo/Heme/Allergies: Negative for polydipsia. Does not bruise/bleed easily.  Psychiatric/Behavioral: Negative for depression and memory loss. The patient is not nervous/anxious.     MEDICATIONS AT HOME:   Prior to Admission medications   Medication Sig Start Date End Date Taking? Authorizing Provider  loratadine (CLARITIN) 10 MG tablet Take 10 mg by mouth daily.   Yes Historical Provider, MD  naproxen (NAPROSYN) 500 MG tablet Take one po bid x 1 week then prn pain, take with food 11/19/12   Eustaquio BoydenJavier Gutierrez, MD      VITAL SIGNS:  Blood pressure 123/46, pulse 92, temperature 99 F (37.2  C), temperature source Oral, resp. rate 16, height 6\' 2"  (1.88 m), weight 81.647 kg (180 lb), SpO2 96 %.  PHYSICAL EXAMINATION:  Physical Exam  GENERAL:  40 y.o.-year-old patient lying in the bed in no acute distress.  EYES: Pupils equal, round, reactive to light and accommodation. + scleral icterus. Extraocular muscles intact.  HEENT: Head atraumatic, normocephalic. Oropharynx and nasopharynx clear. No oropharyngeal erythema, moist oral  mucosa  NECK:  Supple, no jugular venous distention. No thyroid enlargement, no tenderness.  LUNGS: Diffuse rhonchi, wheezing. Good air entry bilaterally, No use of accessory muscles of respiration.  CARDIOVASCULAR: S1, S2 RRR. No murmurs, rubs, gallops, clicks.  ABDOMEN: Soft, nontender, slightly distended with positive fluid wave.Bowel sounds present. No organomegaly or mass.  EXTREMITIES: No pedal edema, cyanosis, or clubbing. + 2 pedal & radial pulses b/l.   NEUROLOGIC: Cranial nerves II through XII are intact. No focal Motor or sensory deficits appreciated b/l PSYCHIATRIC: The patient is alert and oriented x 3. Good affect.  SKIN: No obvious rash, lesion, or ulcer. Jaundiced  LABORATORY PANEL:   CBC  Recent Labs Lab 2016-02-12 1518  WBC 12.4*  HGB 6.5*  HCT 18.3*  PLT 79*   ------------------------------------------------------------------------------------------------------------------  Chemistries   Recent Labs Lab 02/12/16 1437  NA 127*  K 2.6*  CL 86*  CO2 26  GLUCOSE 171*  BUN 24*  CREATININE 0.87  CALCIUM 9.0  AST 129*  ALT 34  ALKPHOS 75  BILITOT 15.2*   ------------------------------------------------------------------------------------------------------------------  Cardiac Enzymes No results for input(s): TROPONINI in the last 168 hours. ------------------------------------------------------------------------------------------------------------------  RADIOLOGY:  Ct Chest W Contrast  02-12-16  CLINICAL DATA:  Patient with 5 days of vomiting.  Constipation. EXAM: CT CHEST, ABDOMEN, AND PELVIS WITH CONTRAST TECHNIQUE: Multidetector CT imaging of the chest, abdomen and pelvis was performed following the standard protocol during bolus administration of intravenous contrast. CONTRAST:  ISOVUE-300 IOPAMIDOL (ISOVUE-300) INJECTION 61% COMPARISON:  Ultrasound abdomen pelvis earlier same day. FINDINGS: CT CHEST FINDINGS Mediastinum/Lymph Nodes: Visualized  thyroid is unremarkable. Bilateral gynecomastia. Normal heart size. No pericardial effusion. Aorta and main pulmonary artery normal in caliber. No axillary, mediastinal or hilar lymphadenopathy. Prominent superior mediastinal lymph nodes. Lungs/Pleura: Central airways are patent. Multiple ground-glass tree-in-bud nodular opacities are demonstrated within the superior segment of the left lower lobe (image 83; series 4). Additionally there are scattered ground-glass nodular opacities demonstrated throughout the left upper lobe, right upper lobe and right middle lobe. Reference nodule within the right upper lobe measures 2.1 x 1.0 cm (image 73; series 4). Reference nodule within the left upper lobe measures 2.0 x 1.7 cm (image 30; series 4). No pleural effusion or pneumothorax. Musculoskeletal: No aggressive or acute appearing osseous lesions. Old healed left posterior fifth rib fracture. Multiple healed posterior right rib fractures. CT ABDOMEN PELVIS FINDINGS Hepatobiliary: Liver is nodular in contour. Timing of contrast bolus limits evaluation for possible hepatic lesion. Gallbladder is unremarkable. No intrahepatic or extrahepatic biliary ductal dilatation. Re- cannulization of the periumbilical vessels. Pancreas: Unremarkable. Spleen: Marked splenomegaly measuring up to 18 cm. Adrenals/Urinary Tract: The adrenal glands are unremarkable. Kidneys enhance symmetrically with contrast. No hydronephrosis. Urinary bladder is unremarkable. Stomach/Bowel: Suggestion of wall thickening of the sigmoid colon (image 116; series 2). No evidence for bowel obstruction. Small amount of free fluid within the paracolic gutters bilaterally. No free intraperitoneal air. Vascular/Lymphatic: Normal caliber abdominal aorta. Multiple predominately sub cm retroperitoneal lymph nodes are demonstrated. Other: Central dystrophic calcifications in the prostate. Musculoskeletal: No aggressive or acute appearing  osseous lesions. Bilateral L5  pars defects. Grade 1 anterolisthesis L5-S1. IMPRESSION: Tree-in-bud ground-glass opacities within the superior segment left lower lobe concerning for atypical infectious process. Multiple additional larger ground-glass opacities throughout the lungs bilaterally measuring up to 2.1 cm. These are nonspecific and follow-up chest CT in 3 months is recommended to assess for interval change. Suggestion of wall thickening of the sigmoid colon, potentially secondary to underdistention. Colitis is an additional consideration. Recommend clinical correlation. Morphologic changes to the liver suggestive of cirrhosis. Additionally there are findings compatible with portal venous hypertension including marked splenomegaly and re- cannulization of the periumbilical vein. Small amount of ascites within the paracolic gutters and central upper abdomen. Electronically Signed   By: Annia Belt M.D.   On: February 14, 2016 18:33   Ct Abdomen Pelvis W Contrast  2016-02-14  CLINICAL DATA:  Patient with 5 days of vomiting.  Constipation. EXAM: CT CHEST, ABDOMEN, AND PELVIS WITH CONTRAST TECHNIQUE: Multidetector CT imaging of the chest, abdomen and pelvis was performed following the standard protocol during bolus administration of intravenous contrast. CONTRAST:  ISOVUE-300 IOPAMIDOL (ISOVUE-300) INJECTION 61% COMPARISON:  Ultrasound abdomen pelvis earlier same day. FINDINGS: CT CHEST FINDINGS Mediastinum/Lymph Nodes: Visualized thyroid is unremarkable. Bilateral gynecomastia. Normal heart size. No pericardial effusion. Aorta and main pulmonary artery normal in caliber. No axillary, mediastinal or hilar lymphadenopathy. Prominent superior mediastinal lymph nodes. Lungs/Pleura: Central airways are patent. Multiple ground-glass tree-in-bud nodular opacities are demonstrated within the superior segment of the left lower lobe (image 83; series 4). Additionally there are scattered ground-glass nodular opacities demonstrated throughout the  left upper lobe, right upper lobe and right middle lobe. Reference nodule within the right upper lobe measures 2.1 x 1.0 cm (image 73; series 4). Reference nodule within the left upper lobe measures 2.0 x 1.7 cm (image 30; series 4). No pleural effusion or pneumothorax. Musculoskeletal: No aggressive or acute appearing osseous lesions. Old healed left posterior fifth rib fracture. Multiple healed posterior right rib fractures. CT ABDOMEN PELVIS FINDINGS Hepatobiliary: Liver is nodular in contour. Timing of contrast bolus limits evaluation for possible hepatic lesion. Gallbladder is unremarkable. No intrahepatic or extrahepatic biliary ductal dilatation. Re- cannulization of the periumbilical vessels. Pancreas: Unremarkable. Spleen: Marked splenomegaly measuring up to 18 cm. Adrenals/Urinary Tract: The adrenal glands are unremarkable. Kidneys enhance symmetrically with contrast. No hydronephrosis. Urinary bladder is unremarkable. Stomach/Bowel: Suggestion of wall thickening of the sigmoid colon (image 116; series 2). No evidence for bowel obstruction. Small amount of free fluid within the paracolic gutters bilaterally. No free intraperitoneal air. Vascular/Lymphatic: Normal caliber abdominal aorta. Multiple predominately sub cm retroperitoneal lymph nodes are demonstrated. Other: Central dystrophic calcifications in the prostate. Musculoskeletal: No aggressive or acute appearing osseous lesions. Bilateral L5 pars defects. Grade 1 anterolisthesis L5-S1. IMPRESSION: Tree-in-bud ground-glass opacities within the superior segment left lower lobe concerning for atypical infectious process. Multiple additional larger ground-glass opacities throughout the lungs bilaterally measuring up to 2.1 cm. These are nonspecific and follow-up chest CT in 3 months is recommended to assess for interval change. Suggestion of wall thickening of the sigmoid colon, potentially secondary to underdistention. Colitis is an additional  consideration. Recommend clinical correlation. Morphologic changes to the liver suggestive of cirrhosis. Additionally there are findings compatible with portal venous hypertension including marked splenomegaly and re- cannulization of the periumbilical vein. Small amount of ascites within the paracolic gutters and central upper abdomen. Electronically Signed   By: Annia Belt M.D.   On: 2016/02/14 18:33   US Abdomen Limited Ruq  02/13/2016  CLINICAL DATA:  Patient with elevated LFTs.  Vomiting.  Jaundice. EXAM: US ABDOMEN LIMITED - RIGHT UPPER QUADRANT COMPARISON:  None. FINDINGS: Gallbladder: No gallstones or wall thickening visualized. No sonographic Murphy sign noted by sonographer. Common bile duct: Diameter: 4 mm Liver: Diffusely increased in echogenicity. No focal lesion identified. There is mild nodularity of the surface of the liver. Incidentally identified is a 3.9 x 1.7 x 3.5 cm masslike area in the retrosternal location, superior to the liver with internal color vascularity. IMPRESSION: Indeterminate 3.9 cm masslike area located just inferior to the sternum with internal vascularity. While this may potentially represent a mass emanating from the liver, additional etiologies are not excluded and this is indeterminate on ultrasound. This needs dedicated evaluation with contrast-enhanced CT. Hepatic steatosis. Mild nodularity of the liver raising the possibility of cirrhosis. These results will be called to the ordering clinician or representative by the Radiologist Assistant, and communication documented in the PACS or zVision Dashboard. Electronically Signed   By: Annia Belt M.D.   On: 02/13/2016 16:08     IMPRESSION AND PLAN:   40 year old male with past medical history of alcohol abuse, depression, OCD who presents to the hospital due to intractable nausea vomiting and noted to be jaundiced and also anemic.  1. Abnormal LFTs/alcoholic hepatitis-this is the cause of patient's abnormal liver  function tests. -Patient's discriminant factor is greater than 40. I will start him on some prednisone -Continue supportive care, follow LFTs. Get a gastroenterology consult in the morning.  2. Symptomatic anemia-patient's hemoglobin is 6.5. He is complaining of some weakness and shortness of breath. -This is likely secondary to his alcoholic liver disease. I will transfuse him 1 unit here at follow hemoglobin. -Given his history of alcohol abuse he may benefit from an endoscopy. I will defer this to gastroenterology. -He was Hemoccult negative as per the ER. I will place him on oral Protonix for now.  3. Hypokalemia-I will place him on oral potassium supplements. Repeat level in the morning. -Check magnesium level.  4. Hyponatremia-secondary to liver disease. Gently hydrate with IV fluids. Follow sodium.  5. Pneumonia-this was incidentally seen on the CT scan of the abdomen and pelvis. -I will place him on IV Levaquin and follow blood, sputum cultures.  6. COPD-no acute exacerbation. Place him on when necessary DuoNeb's.  7. Intractable nausea and vomiting-etiology unclear but probably related to underlying alcoholic hepatitis combined with possible gastritis given his alcohol abuse. -Pleasant on a clear liquid diet, supportive care with IV fluids, antiemetics. Gastroenterology consult in the morning. -Patient's CT abdomen pelvis does not show any evidence of acute intra-abdominal pathology.  8. Thrombocytopenia - related to Liver Disease/Cirrhosis.  - follow counts.  No acute bleeding presently.   All the records are reviewed and case discussed with ED provider. Management plans discussed with the patient, family and they are in agreement.  CODE STATUS: Full  TOTAL TIME TAKING CARE OF THIS PATIENT: 45 minutes.    Houston Siren M.D on 02/13/2016 at 7:35 PM  Between 7am to 6pm - Pager - [phone]  After 6pm go to [email or web address] - password [email or web address] [facility]  [place]  [facility]  Office  [phone]  CC: Primary care physician; Eustaquio Boyden, MD

## 2016-01-30 NOTE — Progress Notes (Signed)
ANTIBIOTIC CONSULT NOTE - INITIAL  Pharmacy Consult for Levaquin  Indication: pneumonia  No Known Allergies  Patient Measurements: Height: 6\' 2"  (188 cm) Weight: 180 lb (81.647 kg) IBW/kg (Calculated) : 82.2 Adjusted Body Weight:   Vital Signs: Temp: 99 F (37.2 C) (07/16 1433) Temp Source: Oral (07/16 1433) BP: 123/46 mmHg (07/16 1900) Pulse Rate: 92 (07/16 1900) Intake/Output from previous day:   Intake/Output from this shift:    Labs:  Recent Labs  02/12/2016 1437 01/27/2016 1518  WBC  --  12.4*  HGB  --  6.5*  PLT  --  79*  CREATININE 0.87  --    Estimated Creatinine Clearance: 127.7 mL/min (by C-G formula based on Cr of 0.87). No results for input(s): VANCOTROUGH, VANCOPEAK, VANCORANDOM, GENTTROUGH, GENTPEAK, GENTRANDOM, TOBRATROUGH, TOBRAPEAK, TOBRARND, AMIKACINPEAK, AMIKACINTROU, AMIKACIN in the last 72 hours.   Microbiology: No results found for this or any previous visit (from the past 720 hour(s)).  Medical History: Past Medical History  Diagnosis Date  . Depression   . OCD (obsessive compulsive disorder)   . Headache(784.0)   . LBP (low back pain)   . Alcohol abuse     Medications:   (Not in a hospital admission) Assessment: CrCl = 127.7 ml/min   Goal of Therapy:  Resolution of infection   Plan:  Expected duration 7 days with resolution of temperature and/or normalization of WBC   Will start levaquin 500 mg IV Q24H on 7/16.  Pharmacy will continue to follow.   Deunte Bledsoe D 02/09/2016,8:03 PM

## 2016-01-30 NOTE — ED Notes (Signed)
Pt vomiting x 5 days, was constipated and took meds which made him go. Normally drinks a few beers per day. Eyes and skin noticibly yellow

## 2016-01-31 DIAGNOSIS — R1110 Vomiting, unspecified: Secondary | ICD-10-CM

## 2016-01-31 DIAGNOSIS — D62 Acute posthemorrhagic anemia: Secondary | ICD-10-CM

## 2016-01-31 LAB — BASIC METABOLIC PANEL
Anion gap: 7 (ref 5–15)
BUN: 21 mg/dL — AB (ref 6–20)
CALCIUM: 8.1 mg/dL — AB (ref 8.9–10.3)
CO2: 27 mmol/L (ref 22–32)
Chloride: 95 mmol/L — ABNORMAL LOW (ref 101–111)
Creatinine, Ser: 0.59 mg/dL — ABNORMAL LOW (ref 0.61–1.24)
GFR calc Af Amer: >60 mL/min (ref >60)
GLUCOSE: 91 mg/dL (ref 65–99)
Potassium: 2.5 mmol/L — CL (ref 3.5–5.1)
Sodium: 129 mmol/L — ABNORMAL LOW (ref 135–145)

## 2016-01-31 LAB — MAGNESIUM
MAGNESIUM: 1.2 mg/dL — AB (ref 1.7–2.4)
Magnesium: 2.1 mg/dL (ref 1.7–2.4)

## 2016-01-31 LAB — CBC
HEMATOCRIT: 16.3 % — AB (ref 40.0–52.0)
Hemoglobin: 5.8 g/dL — ABNORMAL LOW (ref 13.0–18.0)
MCH: 40.5 pg — ABNORMAL HIGH (ref 26.0–34.0)
MCHC: 35.9 g/dL (ref 32.0–36.0)
MCV: 112.8 fL — ABNORMAL HIGH (ref 80.0–100.0)
Platelets: 66 10*3/uL — ABNORMAL LOW (ref 150–440)
RBC: 1.44 MIL/uL — ABNORMAL LOW (ref 4.40–5.90)
RDW: 20.9 % — AB (ref 11.5–14.5)
WBC: 9.1 10*3/uL (ref 3.8–10.6)

## 2016-01-31 LAB — HEPATIC FUNCTION PANEL
ALBUMIN: 2.5 g/dL — AB (ref 3.5–5.0)
ALT: 28 U/L (ref 17–63)
AST: 97 U/L — ABNORMAL HIGH (ref 15–41)
Alkaline Phosphatase: 59 U/L (ref 38–126)
BILIRUBIN INDIRECT: 8.5 mg/dL — AB (ref 0.3–0.9)
BILIRUBIN TOTAL: 12.2 mg/dL — AB (ref 0.3–1.2)
Bilirubin, Direct: 3.7 mg/dL — ABNORMAL HIGH (ref 0.1–0.5)
TOTAL PROTEIN: 6.7 g/dL (ref 6.5–8.1)

## 2016-01-31 LAB — PATHOLOGIST SMEAR REVIEW

## 2016-01-31 LAB — PREPARE RBC (CROSSMATCH)

## 2016-01-31 LAB — POTASSIUM: Potassium: 3.2 mmol/L — ABNORMAL LOW (ref 3.5–5.1)

## 2016-01-31 MED ORDER — SODIUM CHLORIDE 0.9 % IV SOLN
Freq: Once | INTRAVENOUS | Status: AC
  Administered 2016-01-31: 16:00:00 via INTRAVENOUS

## 2016-01-31 MED ORDER — POTASSIUM CHLORIDE CRYS ER 20 MEQ PO TBCR
40.0000 meq | EXTENDED_RELEASE_TABLET | Freq: Once | ORAL | Status: AC
  Administered 2016-01-31: 40 meq via ORAL
  Filled 2016-01-31: qty 2

## 2016-01-31 MED ORDER — POTASSIUM CHLORIDE 10 MEQ/100ML IV SOLN
10.0000 meq | INTRAVENOUS | Status: AC
  Administered 2016-01-31 (×4): 10 meq via INTRAVENOUS
  Filled 2016-01-31 (×4): qty 100

## 2016-01-31 MED ORDER — MAGNESIUM SULFATE 4 GM/100ML IV SOLN
4.0000 g | Freq: Once | INTRAVENOUS | Status: AC
Start: 2016-01-31 — End: 2016-01-31
  Administered 2016-01-31: 4 g via INTRAVENOUS
  Filled 2016-01-31: qty 100

## 2016-01-31 MED ORDER — MAGNESIUM OXIDE 400 (241.3 MG) MG PO TABS: 400.0000 mg | ORAL_TABLET | Freq: Every day | ORAL | Status: DC

## 2016-01-31 MED ORDER — ZOLPIDEM TARTRATE 5 MG PO TABS
10.0000 mg | ORAL_TABLET | Freq: Every evening | ORAL | Status: DC | PRN
  Administered 2016-01-31 – 2016-02-02 (×2): 10 mg via ORAL
  Filled 2016-01-31 (×2): qty 2

## 2016-01-31 MED ORDER — NICOTINE 14 MG/24HR TD PT24
14.0000 mg | MEDICATED_PATCH | Freq: Every day | TRANSDERMAL | Status: DC
  Administered 2016-01-31 – 2016-02-01 (×4): 14 mg via TRANSDERMAL
  Filled 2016-01-31 (×4): qty 1
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
## 2016-01-31 NOTE — Progress Notes (Addendum)
[facility] - [place] at [facility]   PATIENT NAME: Kentley Blyden    MR#:  [id number]  DATE OF BIRTH:  [birth year]  SUBJECTIVE:  CHIEF COMPLAINT:   Chief Complaint  Patient presents with  . Emesis   Vomiting has resolved. Still has mild nausea. No shortness of breath or fever.  REVIEW OF SYSTEMS:    Review of Systems  Constitutional: Positive for malaise/fatigue. Negative for fever and chills.  HENT: Negative for sore throat.   Respiratory: Negative for cough, hemoptysis, shortness of breath and wheezing.   Cardiovascular: Negative for chest pain, palpitations, orthopnea and leg swelling.  Gastrointestinal: Positive for nausea. Negative for heartburn, vomiting, abdominal pain, diarrhea and constipation.  Genitourinary: Negative for dysuria.  Skin: Negative for rash.  Neurological: Positive for weakness. Negative for sensory change, speech change, focal weakness and headaches.  Psychiatric/Behavioral: Negative for depression. The patient is not nervous/anxious.     DRUG ALLERGIES:  No Known Allergies  VITALS:  Blood pressure 95/51, pulse 82, temperature 99.2 F (37.3 C), temperature source Oral, resp. rate 17, height 5\' 7"  (1.702 m), weight 83.144 kg (183 lb 4.8 oz), SpO2 91 %.  PHYSICAL EXAMINATION:   Physical Exam  GENERAL:  40 y.o.-year-old patient lying in the bed with no acute distress.  EYES: Pupils equal, round, reactive to light and accommodation. Extraocular muscles intact. Positive icterus HEENT: Head atraumatic, normocephalic. Oropharynx and nasopharynx clear.  NECK:  Supple, no jugular venous distention. No thyroid enlargement, no tenderness.  LUNGS: Normal breath sounds bilaterally, no wheezing, rales, rhonchi. No use of accessory muscles of respiration.  CARDIOVASCULAR: S1, S2 normal. No murmurs, rubs, or gallops.  ABDOMEN: Soft, nontender, nondistended. Bowel sounds present. No organomegaly or mass.  EXTREMITIES: No cyanosis,  clubbing or edema b/l.    NEUROLOGIC: Cranial nerves II through XII are intact. No focal Motor or sensory deficits b/l.   PSYCHIATRIC: The patient is alert and oriented x 3.  SKIN: No obvious rash, lesion, or ulcer.   LABORATORY PANEL:   CBC  Recent Labs Lab 01/31/16 0605  WBC 9.1  HGB 5.8*  HCT 16.3*  PLT 66*   ------------------------------------------------------------------------------------------------------------------ Chemistries   Recent Labs Lab 01/31/16 0605  NA 129*  K 2.5*  CL 95*  CO2 27  GLUCOSE 91  BUN 21*  CREATININE 0.59*  CALCIUM 8.1*  MG 1.2*  AST 97*  ALT 28  ALKPHOS 59  BILITOT 12.2*   ------------------------------------------------------------------------------------------------------------------  Cardiac Enzymes No results for input(s): TROPONINI in the last 168 hours. ------------------------------------------------------------------------------------------------------------------  RADIOLOGY:  Ct Chest W Contrast  01/15/2016  CLINICAL DATA:  Patient with 5 days of vomiting.  Constipation. EXAM: CT CHEST, ABDOMEN, AND PELVIS WITH CONTRAST TECHNIQUE: Multidetector CT imaging of the chest, abdomen and pelvis was performed following the standard protocol during bolus administration of intravenous contrast. CONTRAST:  ISOVUE-300 IOPAMIDOL (ISOVUE-300) INJECTION 61% COMPARISON:  Ultrasound abdomen pelvis earlier same day. FINDINGS: CT CHEST FINDINGS Mediastinum/Lymph Nodes: Visualized thyroid is unremarkable. Bilateral gynecomastia. Normal heart size. No pericardial effusion. Aorta and main pulmonary artery normal in caliber. No axillary, mediastinal or hilar lymphadenopathy. Prominent superior mediastinal lymph nodes. Lungs/Pleura: Central airways are patent. Multiple ground-glass tree-in-bud nodular opacities are demonstrated within the superior segment of the left lower lobe (image 83; series 4). Additionally there are scattered ground-glass  nodular opacities demonstrated throughout the left upper lobe, right upper lobe and right middle lobe. Reference nodule within the right upper lobe measures 2.1 x 1.0 cm (image 73; series  4). Reference nodule within the left upper lobe measures 2.0 x 1.7 cm (image 30; series 4). No pleural effusion or pneumothorax. Musculoskeletal: No aggressive or acute appearing osseous lesions. Old healed left posterior fifth rib fracture. Multiple healed posterior right rib fractures. CT ABDOMEN PELVIS FINDINGS Hepatobiliary: Liver is nodular in contour. Timing of contrast bolus limits evaluation for possible hepatic lesion. Gallbladder is unremarkable. No intrahepatic or extrahepatic biliary ductal dilatation. Re- cannulization of the periumbilical vessels. Pancreas: Unremarkable. Spleen: Marked splenomegaly measuring up to 18 cm. Adrenals/Urinary Tract: The adrenal glands are unremarkable. Kidneys enhance symmetrically with contrast. No hydronephrosis. Urinary bladder is unremarkable. Stomach/Bowel: Suggestion of wall thickening of the sigmoid colon (image 116; series 2). No evidence for bowel obstruction. Small amount of free fluid within the paracolic gutters bilaterally. No free intraperitoneal air. Vascular/Lymphatic: Normal caliber abdominal aorta. Multiple predominately sub cm retroperitoneal lymph nodes are demonstrated. Other: Central dystrophic calcifications in the prostate. Musculoskeletal: No aggressive or acute appearing osseous lesions. Bilateral L5 pars defects. Grade 1 anterolisthesis L5-S1. IMPRESSION: Tree-in-bud ground-glass opacities within the superior segment left lower lobe concerning for atypical infectious process. Multiple additional larger ground-glass opacities throughout the lungs bilaterally measuring up to 2.1 cm. These are nonspecific and follow-up chest CT in 3 months is recommended to assess for interval change. Suggestion of wall thickening of the sigmoid colon, potentially secondary to  underdistention. Colitis is an additional consideration. Recommend clinical correlation. Morphologic changes to the liver suggestive of cirrhosis. Additionally there are findings compatible with portal venous hypertension including marked splenomegaly and re- cannulization of the periumbilical vein. Small amount of ascites within the paracolic gutters and central upper abdomen. Electronically Signed   By: Annia Belt M.D.   On: 01/28/2016 18:33   Ct Abdomen Pelvis W Contrast  02/10/2016  CLINICAL DATA:  Patient with 5 days of vomiting.  Constipation. EXAM: CT CHEST, ABDOMEN, AND PELVIS WITH CONTRAST TECHNIQUE: Multidetector CT imaging of the chest, abdomen and pelvis was performed following the standard protocol during bolus administration of intravenous contrast. CONTRAST:  ISOVUE-300 IOPAMIDOL (ISOVUE-300) INJECTION 61% COMPARISON:  Ultrasound abdomen pelvis earlier same day. FINDINGS: CT CHEST FINDINGS Mediastinum/Lymph Nodes: Visualized thyroid is unremarkable. Bilateral gynecomastia. Normal heart size. No pericardial effusion. Aorta and main pulmonary artery normal in caliber. No axillary, mediastinal or hilar lymphadenopathy. Prominent superior mediastinal lymph nodes. Lungs/Pleura: Central airways are patent. Multiple ground-glass tree-in-bud nodular opacities are demonstrated within the superior segment of the left lower lobe (image 83; series 4). Additionally there are scattered ground-glass nodular opacities demonstrated throughout the left upper lobe, right upper lobe and right middle lobe. Reference nodule within the right upper lobe measures 2.1 x 1.0 cm (image 73; series 4). Reference nodule within the left upper lobe measures 2.0 x 1.7 cm (image 30; series 4). No pleural effusion or pneumothorax. Musculoskeletal: No aggressive or acute appearing osseous lesions. Old healed left posterior fifth rib fracture. Multiple healed posterior right rib fractures. CT ABDOMEN PELVIS FINDINGS  Hepatobiliary: Liver is nodular in contour. Timing of contrast bolus limits evaluation for possible hepatic lesion. Gallbladder is unremarkable. No intrahepatic or extrahepatic biliary ductal dilatation. Re- cannulization of the periumbilical vessels. Pancreas: Unremarkable. Spleen: Marked splenomegaly measuring up to 18 cm. Adrenals/Urinary Tract: The adrenal glands are unremarkable. Kidneys enhance symmetrically with contrast. No hydronephrosis. Urinary bladder is unremarkable. Stomach/Bowel: Suggestion of wall thickening of the sigmoid colon (image 116; series 2). No evidence for bowel obstruction. Small amount of free fluid within the paracolic gutters bilaterally. No free intraperitoneal  air. Vascular/Lymphatic: Normal caliber abdominal aorta. Multiple predominately sub cm retroperitoneal lymph nodes are demonstrated. Other: Central dystrophic calcifications in the prostate. Musculoskeletal: No aggressive or acute appearing osseous lesions. Bilateral L5 pars defects. Grade 1 anterolisthesis L5-S1. IMPRESSION: Tree-in-bud ground-glass opacities within the superior segment left lower lobe concerning for atypical infectious process. Multiple additional larger ground-glass opacities throughout the lungs bilaterally measuring up to 2.1 cm. These are nonspecific and follow-up chest CT in 3 months is recommended to assess for interval change. Suggestion of wall thickening of the sigmoid colon, potentially secondary to underdistention. Colitis is an additional consideration. Recommend clinical correlation. Morphologic changes to the liver suggestive of cirrhosis. Additionally there are findings compatible with portal venous hypertension including marked splenomegaly and re- cannulization of the periumbilical vein. Small amount of ascites within the paracolic gutters and central upper abdomen. Electronically Signed   By: Annia Belt M.D.   On: 01/25/2016 18:33   US Abdomen Limited Ruq  01/25/2016  CLINICAL DATA:   Patient with elevated LFTs.  Vomiting.  Jaundice. EXAM: US ABDOMEN LIMITED - RIGHT UPPER QUADRANT COMPARISON:  None. FINDINGS: Gallbladder: No gallstones or wall thickening visualized. No sonographic Murphy sign noted by sonographer. Common bile duct: Diameter: 4 mm Liver: Diffusely increased in echogenicity. No focal lesion identified. There is mild nodularity of the surface of the liver. Incidentally identified is a 3.9 x 1.7 x 3.5 cm masslike area in the retrosternal location, superior to the liver with internal color vascularity. IMPRESSION: Indeterminate 3.9 cm masslike area located just inferior to the sternum with internal vascularity. While this may potentially represent a mass emanating from the liver, additional etiologies are not excluded and this is indeterminate on ultrasound. This needs dedicated evaluation with contrast-enhanced CT. Hepatic steatosis. Mild nodularity of the liver raising the possibility of cirrhosis. These results will be called to the ordering clinician or representative by the Radiologist Assistant, and communication documented in the PACS or zVision Dashboard. Electronically Signed   By: Annia Belt M.D.   On: 01/25/2016 16:08     ASSESSMENT AND PLAN:   40 year old male with past medical history of alcohol abuse, depression, OCD who presents to the hospital due to intractable nausea vomiting and noted to be jaundiced and also anemic.  1. Acute alcoholic hepatitis over cirrhosis We will also check hepatitis panel. Bilirubin improving GI consulted. Prednisone started.  2. Symptomatic anemia- hemoglobin is worse today in spite of one unit packed RBC transfusion. Stool Hemoccult negative in the emergency room. But suspicion for GI bleed with history of cirrhosis and dropping hemoglobin. GI consulted. On Protonix. Monitor hemoglobin. Transfuse 2 more units packed RBC now.  Will also discuss with pathology for hemolysis on smear  3. Hypokalemia With  hypomagnesemia Winnie low levels. We will need to replace IV and oral. Repeat labs in the evening.  4. Hyponatremia-secondary to liver disease. Gently hydrate with IV fluids. Follow sodium.  5. Pneumonia-this was incidentally seen on the CT scan of the abdomen and pelvis. -On Levaquin  6. COPD-no acute exacerbation. As needed DuoNeb's.  7. Intractable nausea and vomiting Likely due to alcohol abuse and hepatitis. Improved significantly.  8. Thrombocytopenia - related to Liver Disease/Cirrhosis.  - follow counts.  All the records are reviewed and case discussed with Care Management/Social Workerr. Management plans discussed with the patient, family and they are in agreement.  CODE STATUS: FULL CODE  DVT Prophylaxis: SCDs  TOTAL CRITICAL CARE TIME TAKING CARE OF THIS PATIENT: 35 minutes.   POSSIBLE D/C  IN 2-3 DAYS, DEPENDING ON CLINICAL CONDITION.  Milagros LollSudini, Analeise Mccleery R M.D on 01/31/2016 at 12:09 PM  Between 7am to 6pm - Pager - [phone]  After 6pm go to [email or web address] - password [email or web address]  [facility]  Office  [phone]  CC: Primary care physician; Eustaquio BoydenJavier Gutierrez, MD  Note: This dictation was prepared with Dragon dictation along with smaller phrase technology. Any transcriptional errors that result from this process are unintentional.

## 2016-01-31 NOTE — Progress Notes (Signed)
Made Dr. Elpidio AnisSudini aware of potassium 2.5.  New order for 40mEq po and 40mEq IV.  Orson Apeanielle Jeanne Terrance, RN

## 2016-01-31 NOTE — Progress Notes (Addendum)
MEDICATION RELATED CONSULT NOTE - INITIAL   Pharmacy Consult for electrolyte replacement Indication: hypokalemia  No Known Allergies  Patient Measurements: Height: 5\' 7"  (170.2 cm) Weight: 183 lb 4.8 oz (83.144 kg) IBW/kg (Calculated) : 66.1 Adjusted Body Weight:   Vital Signs: Temp: 98.8 F (37.1 C) (07/17 2118) Temp Source: Oral (07/17 2118) BP: 114/56 mmHg (07/17 2118) Pulse Rate: 95 (07/17 2118) Intake/Output from previous day: 07/16 0701 - 07/17 0700 In: 391 [Blood:391] Out: 1 [Urine:1] Intake/Output from this shift: Total I/O In: 290 [Blood:290] Out: -   Labs:  Recent Labs  01/17/2016 1437 01/29/2016 1518 01/31/16 0605 01/31/16 2156  WBC  --  12.4* 9.1  --   HGB  --  6.5* 5.8*  --   HCT  --  18.3* 16.3*  --   PLT  --  79* 66*  --   CREATININE 0.87  --  0.59*  --   MG 1.5*  --  1.2* 2.1  ALBUMIN 3.2*  --  2.5*  --   PROT 8.6*  --  6.7  --   AST 129*  --  97*  --   ALT 34  --  28  --   ALKPHOS 75  --  59  --   BILITOT 15.2*  --  12.2*  --   BILIDIR 3.7*  --  3.7*  --   IBILI  --   --  8.5*  --    Estimated Creatinine Clearance: 124 mL/min (by C-G formula based on Cr of 0.59).   Microbiology: No results found for this or any previous visit (from the past 720 hour(s)).  Medical History: Past Medical History  Diagnosis Date  . Depression   . OCD (obsessive compulsive disorder)   . Headache(784.0)   . LBP (low back pain)   . Alcohol Torres     Medications:  Infusions:  . 0.9 % NaCl with KCl 20 mEq / L 75 mL/hr at 01/31/16 2035    Assessment: Cameron Torres presents with intractable N/V. Pharmacy consulted to replace electrolytes.  Goal of Therapy:  WNL  Plan:  K 3.2 after approximately 130 mEq replacement, however last dose of oral potassium 20 mEq x 1 was approximately 1 hour before labs drawn. Mg 2.1. Will not order additional replacement at this time - recheck with AM labs.    July 11, 2016 0501 K 3.2, Mg 1.8,  phos unable to report due to icterus. Increased oral potassium chloride to 40 mEq po BID, will recheck all electrolytes tomorrow with AM labs.   Carola FrostNathan A Adelyna Brockman, Pharm.D., BCPS Clinical Pharmacist 01/31/2016,11:06 PM

## 2016-02-01 ENCOUNTER — Encounter: Admission: EM | Disposition: E | Payer: Self-pay | Source: Home / Self Care | Attending: Pulmonary Disease

## 2016-02-01 ENCOUNTER — Inpatient Hospital Stay: Payer: Medicaid Other | Admitting: Anesthesiology

## 2016-02-01 DIAGNOSIS — K766 Portal hypertension: Secondary | ICD-10-CM

## 2016-02-01 DIAGNOSIS — K2970 Gastritis, unspecified, without bleeding: Secondary | ICD-10-CM | POA: Insufficient documentation

## 2016-02-01 DIAGNOSIS — K209 Esophagitis, unspecified without bleeding: Secondary | ICD-10-CM | POA: Insufficient documentation

## 2016-02-01 DIAGNOSIS — D62 Acute posthemorrhagic anemia: Secondary | ICD-10-CM | POA: Insufficient documentation

## 2016-02-01 HISTORY — PX: ESOPHAGOGASTRODUODENOSCOPY (EGD) WITH PROPOFOL: SHX5813

## 2016-02-01 LAB — COMPREHENSIVE METABOLIC PANEL
ALT: 32 U/L (ref 17–63)
ANION GAP: 9 (ref 5–15)
AST: 93 U/L — ABNORMAL HIGH (ref 15–41)
Albumin: 2.7 g/dL — ABNORMAL LOW (ref 3.5–5.0)
Alkaline Phosphatase: 55 U/L (ref 38–126)
BUN: 12 mg/dL (ref 6–20)
CHLORIDE: 96 mmol/L — AB (ref 101–111)
CO2: 24 mmol/L (ref 22–32)
Calcium: 8.2 mg/dL — ABNORMAL LOW (ref 8.9–10.3)
Creatinine, Ser: 0.76 mg/dL (ref 0.61–1.24)
Glucose, Bld: 94 mg/dL (ref 65–99)
POTASSIUM: 3.2 mmol/L — AB (ref 3.5–5.1)
SODIUM: 129 mmol/L — AB (ref 135–145)
Total Bilirubin: 12.5 mg/dL — ABNORMAL HIGH (ref 0.3–1.2)
Total Protein: 7.4 g/dL (ref 6.5–8.1)

## 2016-02-01 LAB — HEPATITIS PANEL, ACUTE
HCV Ab: <0.1 s/co ratio (ref 0.0–0.9)
HEP B S AG: NEGATIVE
Hep A IgM: NEGATIVE
Hep B C IgM: NEGATIVE

## 2016-02-01 LAB — CBC
HEMATOCRIT: 19.5 % — AB (ref 40.0–52.0)
HEMOGLOBIN: 6.9 g/dL — AB (ref 13.0–18.0)
MCH: 38.1 pg — AB (ref 26.0–34.0)
MCHC: 35.4 g/dL (ref 32.0–36.0)
MCV: 107.5 fL — AB (ref 80.0–100.0)
Platelets: 66 10*3/uL — ABNORMAL LOW (ref 150–440)
RBC: 1.81 MIL/uL — AB (ref 4.40–5.90)
RDW: 24.4 % — ABNORMAL HIGH (ref 11.5–14.5)
WBC: 11.2 10*3/uL — AB (ref 3.8–10.6)

## 2016-02-01 LAB — PREPARE RBC (CROSSMATCH)

## 2016-02-01 LAB — MAGNESIUM: Magnesium: 1.8 mg/dL (ref 1.7–2.4)

## 2016-02-01 LAB — PHOSPHORUS: Phosphorus: UNDETERMINED mg/dL (ref 2.5–4.6)

## 2016-02-01 LAB — HEMOGLOBIN AND HEMATOCRIT, BLOOD
HEMATOCRIT: 22.6 % — AB (ref 40.0–52.0)
HEMOGLOBIN: 7.9 g/dL — AB (ref 13.0–18.0)

## 2016-02-01 SURGERY — ESOPHAGOGASTRODUODENOSCOPY (EGD) WITH PROPOFOL
Anesthesia: General

## 2016-02-01 MED ORDER — SODIUM CHLORIDE 0.9 % IV SOLN
INTRAVENOUS | Status: DC | PRN
  Administered 2016-02-01: 09:00:00 via INTRAVENOUS

## 2016-02-01 MED ORDER — ONDANSETRON HCL 4 MG/2ML IJ SOLN
INTRAMUSCULAR | Status: DC | PRN
  Administered 2016-02-01: 4 mg via INTRAVENOUS

## 2016-02-01 MED ORDER — 0.9 % SODIUM CHLORIDE (POUR BTL) OPTIME
1000.0000 mL | Freq: Once | TOPICAL | Status: DC
  Filled 2016-02-01: qty 1000

## 2016-02-01 MED ORDER — FENTANYL CITRATE (PF) 100 MCG/2ML IJ SOLN
INTRAMUSCULAR | Status: DC | PRN
  Administered 2016-02-01: 50 ug via INTRAVENOUS

## 2016-02-01 MED ORDER — PROPOFOL 10 MG/ML IV BOLUS
INTRAVENOUS | Status: DC | PRN
  Administered 2016-02-01: 120 mg via INTRAVENOUS

## 2016-02-01 MED ORDER — LIDOCAINE HCL (CARDIAC) 20 MG/ML IV SOLN
INTRAVENOUS | Status: DC | PRN
  Administered 2016-02-01: 100 mg via INTRAVENOUS

## 2016-02-01 MED ORDER — POTASSIUM CHLORIDE CRYS ER 20 MEQ PO TBCR
40.0000 meq | EXTENDED_RELEASE_TABLET | Freq: Two times a day (BID) | ORAL | Status: DC
  Administered 2016-02-01: 20:00:00 40 meq via ORAL
  Filled 2016-02-01: qty 2

## 2016-02-01 MED ORDER — IPRATROPIUM-ALBUTEROL 0.5-2.5 (3) MG/3ML IN SOLN
3.0000 mL | RESPIRATORY_TRACT | Status: DC | PRN
  Administered 2016-02-02: 03:00:00 3 mL via RESPIRATORY_TRACT
  Filled 2016-02-01: qty 3

## 2016-02-01 MED ORDER — SODIUM CHLORIDE 0.9 % IV SOLN: INTRAVENOUS | Status: DC

## 2016-02-01 MED ORDER — SUCCINYLCHOLINE CHLORIDE 20 MG/ML IJ SOLN
INTRAMUSCULAR | Status: DC | PRN
  Administered 2016-02-01: 100 mg via INTRAVENOUS

## 2016-02-01 MED ORDER — PIPERACILLIN-TAZOBACTAM 3.375 G IVPB
3.3750 g | Freq: Three times a day (TID) | INTRAVENOUS | Status: DC
  Administered 2016-02-01 (×2): 3.375 g via INTRAVENOUS
  Filled 2016-02-01 (×3): qty 50

## 2016-02-01 MED ORDER — SODIUM CHLORIDE 0.9 % IV SOLN
Freq: Once | INTRAVENOUS | Status: AC
  Administered 2016-02-01: 09:00:00 via INTRAVENOUS

## 2016-02-01 MED ORDER — ACETAMINOPHEN 325 MG PO TABS
ORAL_TABLET | ORAL | Status: AC
  Filled 2016-02-01: qty 2

## 2016-02-01 MED ORDER — ACETAMINOPHEN 325 MG PO TABS
650.0000 mg | ORAL_TABLET | Freq: Once | ORAL | Status: AC
  Administered 2016-02-01: 650 mg via ORAL

## 2016-02-01 MED ORDER — PIPERACILLIN-TAZOBACTAM 3.375 G IVPB
3.3750 g | Freq: Three times a day (TID) | INTRAVENOUS | Status: DC
  Filled 2016-02-01 (×2): qty 50

## 2016-02-01 NOTE — Progress Notes (Signed)
[facility] - [place] at [facility]   PATIENT NAME: Cameron Torres    MR#:  [id number]  DATE OF BIRTH:  04-07-1976  SUBJECTIVE:  CHIEF COMPLAINT:   Chief Complaint  Patient presents with  . Emesis   Vomiting has resolved. No abdominal pain. Patient had EGD earlier today.  He has had yellow sputum. Some shortness of breath. He does complain of chronic cough. Saturations dipping into the 70s at times.  REVIEW OF SYSTEMS:    Review of Systems  Constitutional: Positive for malaise/fatigue. Negative for fever and chills.  HENT: Negative for sore throat.   Respiratory: Negative for cough, hemoptysis, shortness of breath and wheezing.   Cardiovascular: Negative for chest pain, palpitations, orthopnea and leg swelling.  Gastrointestinal: Positive for nausea. Negative for heartburn, vomiting, abdominal pain, diarrhea and constipation.  Genitourinary: Negative for dysuria.  Skin: Negative for rash.  Neurological: Positive for weakness. Negative for sensory change, speech change, focal weakness and headaches.  Psychiatric/Behavioral: Negative for depression. The patient is not nervous/anxious.     DRUG ALLERGIES:  No Known Allergies  VITALS:  Blood pressure 96/64, pulse 91, temperature 98.7 F (37.1 C), temperature source Oral, resp. rate 24, height 5\' 7"  (1.702 m), weight 83.144 kg (183 lb 4.8 oz), SpO2 94 %.  PHYSICAL EXAMINATION:   Physical Exam  GENERAL:  40 y.o.-year-old patient lying in the bed with no acute distress.  EYES: Pupils equal, round, reactive to light and accommodation. Extraocular muscles intact. Positive icterus HEENT: Head atraumatic, normocephalic. Oropharynx and nasopharynx clear.  NECK:  Supple, no jugular venous distention. No thyroid enlargement, no tenderness.  LUNGS: Normal breath sounds bilaterally, no wheezing, rales, rhonchi. No use of accessory muscles of respiration.  CARDIOVASCULAR: S1, S2 normal. No murmurs, rubs, or  gallops.  ABDOMEN: Soft, nontender, nondistended. Bowel sounds present. No organomegaly or mass.  EXTREMITIES: No cyanosis, clubbing or edema b/l.    NEUROLOGIC: Cranial nerves II through XII are intact. No focal Motor or sensory deficits b/l.   PSYCHIATRIC: The patient is alert and oriented x 3.  SKIN: No obvious rash, lesion, or ulcer.   LABORATORY PANEL:   CBC  Recent Labs Lab 01/17/2016 0501  WBC 11.2*  HGB 6.9*  HCT 19.5*  PLT 66*   ------------------------------------------------------------------------------------------------------------------ Chemistries   Recent Labs Lab 02/13/2016 0501  NA 129*  K 3.2*  CL 96*  CO2 24  GLUCOSE 94  BUN 12  CREATININE 0.76  CALCIUM 8.2*  MG 1.8  AST 93*  ALT 32  ALKPHOS 55  BILITOT 12.5*   ------------------------------------------------------------------------------------------------------------------  Cardiac Enzymes No results for input(s): TROPONINI in the last 168 hours. ------------------------------------------------------------------------------------------------------------------  RADIOLOGY:  Ct Chest W Contrast  01/30/2016  CLINICAL DATA:  Patient with 5 days of vomiting.  Constipation. EXAM: CT CHEST, ABDOMEN, AND PELVIS WITH CONTRAST TECHNIQUE: Multidetector CT imaging of the chest, abdomen and pelvis was performed following the standard protocol during bolus administration of intravenous contrast. CONTRAST:  ISOVUE-300 IOPAMIDOL (ISOVUE-300) INJECTION 61% COMPARISON:  Ultrasound abdomen pelvis earlier same day. FINDINGS: CT CHEST FINDINGS Mediastinum/Lymph Nodes: Visualized thyroid is unremarkable. Bilateral gynecomastia. Normal heart size. No pericardial effusion. Aorta and main pulmonary artery normal in caliber. No axillary, mediastinal or hilar lymphadenopathy. Prominent superior mediastinal lymph nodes. Lungs/Pleura: Central airways are patent. Multiple ground-glass tree-in-bud nodular opacities are  demonstrated within the superior segment of the left lower lobe (image 83; series 4). Additionally there are scattered ground-glass nodular opacities demonstrated throughout the left upper lobe, right  upper lobe and right middle lobe. Reference nodule within the right upper lobe measures 2.1 x 1.0 cm (image 73; series 4). Reference nodule within the left upper lobe measures 2.0 x 1.7 cm (image 30; series 4). No pleural effusion or pneumothorax. Musculoskeletal: No aggressive or acute appearing osseous lesions. Old healed left posterior fifth rib fracture. Multiple healed posterior right rib fractures. CT ABDOMEN PELVIS FINDINGS Hepatobiliary: Liver is nodular in contour. Timing of contrast bolus limits evaluation for possible hepatic lesion. Gallbladder is unremarkable. No intrahepatic or extrahepatic biliary ductal dilatation. Re- cannulization of the periumbilical vessels. Pancreas: Unremarkable. Spleen: Marked splenomegaly measuring up to 18 cm. Adrenals/Urinary Tract: The adrenal glands are unremarkable. Kidneys enhance symmetrically with contrast. No hydronephrosis. Urinary bladder is unremarkable. Stomach/Bowel: Suggestion of wall thickening of the sigmoid colon (image 116; series 2). No evidence for bowel obstruction. Small amount of free fluid within the paracolic gutters bilaterally. No free intraperitoneal air. Vascular/Lymphatic: Normal caliber abdominal aorta. Multiple predominately sub cm retroperitoneal lymph nodes are demonstrated. Other: Central dystrophic calcifications in the prostate. Musculoskeletal: No aggressive or acute appearing osseous lesions. Bilateral L5 pars defects. Grade 1 anterolisthesis L5-S1. IMPRESSION: Tree-in-bud ground-glass opacities within the superior segment left lower lobe concerning for atypical infectious process. Multiple additional larger ground-glass opacities throughout the lungs bilaterally measuring up to 2.1 cm. These are nonspecific and follow-up chest CT in 3  months is recommended to assess for interval change. Suggestion of wall thickening of the sigmoid colon, potentially secondary to underdistention. Colitis is an additional consideration. Recommend clinical correlation. Morphologic changes to the liver suggestive of cirrhosis. Additionally there are findings compatible with portal venous hypertension including marked splenomegaly and re- cannulization of the periumbilical vein. Small amount of ascites within the paracolic gutters and central upper abdomen. Electronically Signed   By: Annia Belt M.D.   On: 01/23/2016 18:33   Ct Abdomen Pelvis W Contrast  01/31/2016  CLINICAL DATA:  Patient with 5 days of vomiting.  Constipation. EXAM: CT CHEST, ABDOMEN, AND PELVIS WITH CONTRAST TECHNIQUE: Multidetector CT imaging of the chest, abdomen and pelvis was performed following the standard protocol during bolus administration of intravenous contrast. CONTRAST:  ISOVUE-300 IOPAMIDOL (ISOVUE-300) INJECTION 61% COMPARISON:  Ultrasound abdomen pelvis earlier same day. FINDINGS: CT CHEST FINDINGS Mediastinum/Lymph Nodes: Visualized thyroid is unremarkable. Bilateral gynecomastia. Normal heart size. No pericardial effusion. Aorta and main pulmonary artery normal in caliber. No axillary, mediastinal or hilar lymphadenopathy. Prominent superior mediastinal lymph nodes. Lungs/Pleura: Central airways are patent. Multiple ground-glass tree-in-bud nodular opacities are demonstrated within the superior segment of the left lower lobe (image 83; series 4). Additionally there are scattered ground-glass nodular opacities demonstrated throughout the left upper lobe, right upper lobe and right middle lobe. Reference nodule within the right upper lobe measures 2.1 x 1.0 cm (image 73; series 4). Reference nodule within the left upper lobe measures 2.0 x 1.7 cm (image 30; series 4). No pleural effusion or pneumothorax. Musculoskeletal: No aggressive or acute appearing osseous lesions.  Old healed left posterior fifth rib fracture. Multiple healed posterior right rib fractures. CT ABDOMEN PELVIS FINDINGS Hepatobiliary: Liver is nodular in contour. Timing of contrast bolus limits evaluation for possible hepatic lesion. Gallbladder is unremarkable. No intrahepatic or extrahepatic biliary ductal dilatation. Re- cannulization of the periumbilical vessels. Pancreas: Unremarkable. Spleen: Marked splenomegaly measuring up to 18 cm. Adrenals/Urinary Tract: The adrenal glands are unremarkable. Kidneys enhance symmetrically with contrast. No hydronephrosis. Urinary bladder is unremarkable. Stomach/Bowel: Suggestion of wall thickening of the sigmoid colon (image  116; series 2). No evidence for bowel obstruction. Small amount of free fluid within the paracolic gutters bilaterally. No free intraperitoneal air. Vascular/Lymphatic: Normal caliber abdominal aorta. Multiple predominately sub cm retroperitoneal lymph nodes are demonstrated. Other: Central dystrophic calcifications in the prostate. Musculoskeletal: No aggressive or acute appearing osseous lesions. Bilateral L5 pars defects. Grade 1 anterolisthesis L5-S1. IMPRESSION: Tree-in-bud ground-glass opacities within the superior segment left lower lobe concerning for atypical infectious process. Multiple additional larger ground-glass opacities throughout the lungs bilaterally measuring up to 2.1 cm. These are nonspecific and follow-up chest CT in 3 months is recommended to assess for interval change. Suggestion of wall thickening of the sigmoid colon, potentially secondary to underdistention. Colitis is an additional consideration. Recommend clinical correlation. Morphologic changes to the liver suggestive of cirrhosis. Additionally there are findings compatible with portal venous hypertension including marked splenomegaly and re- cannulization of the periumbilical vein. Small amount of ascites within the paracolic gutters and central upper abdomen.  Electronically Signed   By: Annia Beltrew  Davis M.D.   On: 01/21/2016 18:33   [place] Abdomen Limited Ruq  02/02/2016  CLINICAL DATA:  Patient with elevated LFTs.  Vomiting.  Jaundice. EXAM: US ABDOMEN LIMITED - RIGHT UPPER QUADRANT COMPARISON:  None. FINDINGS: Gallbladder: No gallstones or wall thickening visualized. No sonographic Murphy sign noted by sonographer. Common bile duct: Diameter: 4 mm Liver: Diffusely increased in echogenicity. No focal lesion identified. There is mild nodularity of the surface of the liver. Incidentally identified is a 3.9 x 1.7 x 3.5 cm masslike area in the retrosternal location, superior to the liver with internal color vascularity. IMPRESSION: Indeterminate 3.9 cm masslike area located just inferior to the sternum with internal vascularity. While this may potentially represent a mass emanating from the liver, additional etiologies are not excluded and this is indeterminate on ultrasound. This needs dedicated evaluation with contrast-enhanced CT. Hepatic steatosis. Mild nodularity of the liver raising the possibility of cirrhosis. These results will be called to the ordering clinician or representative by the Radiologist Assistant, and communication documented in the PACS or zVision Dashboard. Electronically Signed   By: Annia Beltrew  Davis M.D.   On: 01/23/2016 16:08     ASSESSMENT AND PLAN:   40 year old male with past medical history of alcohol abuse, depression, OCD who presents to the hospital due to intractable nausea vomiting and noted to be jaundiced and also anemic.  1. Acute alcoholic hepatitis over cirrhosis Hep A,B,C negative Bilirubin improving GI consulted. Prednisone started. Discussed with Dr. Servando SnareWohl  2. Symptomatic anemia- hemoglobin is worse today in spite blood transfusion Stool Hemoccult negative in the emergency room. But suspicion for GI bleed with history of cirrhosis and dropping hemoglobin.  On Protonix. Monitor hemoglobin. Total 3 units packed RBC transfused  2 today. Transfuse 1 more unit today. EGD showed LA Grade A esophagitis, Gastritis, Portal hypertensive gastropathy. No acute bleeding  Discussed with Dr. Excell SeltzerBaker of pathology. No schistocytes found on the smear reviewed by her.  3. Hypokalemia With hypomagnesemia Replace through IV and oral and improved  4. Mild Hyponatremia-secondary to liver disease.   5. Pneumonia, likely aspiration Will change Levaquin to Zosyn.  6. COPD exacerbation As needed DuoNeb's. Prednisone On antibiotic  7. Intractable nausea and vomiting Likely due to alcohol abuse and hepatitis. Improved significantly.  8. Thrombocytopenia - related to Liver Disease/Cirrhosis.  - follow counts.  All the records are reviewed and case discussed with Care Management/Social Workerr. Management plans discussed with the patient, family and they are in agreement.  CODE  STATUS: FULL CODE  DVT Prophylaxis: SCDs  TOTAL TIME TAKING CARE OF THIS PATIENT: 35 minutes.   POSSIBLE D/C IN 2-3 DAYS, DEPENDING ON CLINICAL CONDITION.  Milagros Loll R M.D on 02/09/2016 at 12:38 PM  Between 7am to 6pm - Pager - [phone]  After 6pm go to [email or web address] - password [email or web address]  [facility]  Office  [phone]  CC: Primary care physician; Eustaquio Boyden, MD  Note: This dictation was prepared with Dragon dictation along with smaller phrase technology. Any transcriptional errors that result from this process are unintentional.

## 2016-02-01 NOTE — Anesthesia Postprocedure Evaluation (Signed)
Anesthesia Post Note  Patient: Cameron Torres  Procedure(s) Performed: Procedure(s) (LRB): ESOPHAGOGASTRODUODENOSCOPY (EGD) WITH PROPOFOL (N/A)  Patient location during evaluation: PACU Anesthesia Type: General Level of consciousness: awake and alert Pain management: pain level controlled Vital Signs Assessment: post-procedure vital signs reviewed and stable Respiratory status: spontaneous breathing, nonlabored ventilation, respiratory function stable and patient connected to nasal cannula oxygen Cardiovascular status: blood pressure returned to baseline and stable Postop Assessment: no signs of nausea or vomiting Anesthetic complications: no    Last Vitals:  Filed Vitals:   01/18/2016 0945 02/10/2016 1000  BP: 112/51 113/57  Pulse: 102 99  Temp: 38.6 C   Resp: 31 27    Last Pain:  Filed Vitals:   02/03/2016 1010  PainSc: 0-No pain                 Yevette EdwardsJames G Adams

## 2016-02-01 NOTE — Anesthesia Preprocedure Evaluation (Signed)
Anesthesia Evaluation  Patient identified by MRN, date of birth, ID band Patient awake    Reviewed: Allergy & Precautions, H&P , NPO status , Patient's Chart, lab work & pertinent test results, reviewed documented beta blocker date and time   Airway Mallampati: II   Neck ROM: full    Dental  (+) Poor Dentition   Pulmonary neg pulmonary ROS, Current Smoker,    Pulmonary exam normal        Cardiovascular negative cardio ROS Normal cardiovascular exam     Neuro/Psych  Headaches, PSYCHIATRIC DISORDERS  Neuromuscular disease negative neurological ROS  negative psych ROS   GI/Hepatic negative GI ROS, Neg liver ROS,   Endo/Other  negative endocrine ROS  Renal/GU negative Renal ROS  negative genitourinary   Musculoskeletal   Abdominal   Peds  Hematology negative hematology ROS (+) anemia ,   Anesthesia Other Findings Past Medical History:   Depression                                                   OCD (obsessive compulsive disorder)                          Headache(784.0)                                              LBP (low back pain)                                          Alcohol abuse                                              No past surgical history on file. BMI    Body Mass Index   28.70 kg/m 2     Reproductive/Obstetrics                             Anesthesia Physical Anesthesia Plan  ASA: III  Anesthesia Plan: General   Post-op Pain Management:    Induction:   Airway Management Planned:   Additional Equipment:   Intra-op Plan:   Post-operative Plan:   Informed Consent: I have reviewed the patients History and Physical, chart, labs and discussed the procedure including the risks, benefits and alternatives for the proposed anesthesia with the patient or authorized representative who has indicated his/her understanding and acceptance.   Dental Advisory Given  Plan  Discussed with: CRNA  Anesthesia Plan Comments:         Anesthesia Quick Evaluation

## 2016-02-01 NOTE — Progress Notes (Signed)
MD made aware of patient oxygen saturation of 77% and BP of 96/64 at 15 min post blood transfusion. Patient placed on 2L-O2. MD to place orders for breathing treatments. Will continue to monitor closely. Bo McclintockBrewer,Samaya Boardley S, RN

## 2016-02-01 NOTE — Progress Notes (Signed)
Patient notified nurse of coughing up thick mucus. Mucus is orange to a reddish color. Will report off to oncoming nurse make MD aware. Will continue to monitor to end of shift.

## 2016-02-01 NOTE — Op Note (Signed)
[facility] Gastroenterology Patient Name: Cameron Torres Procedure Date: 01/24/2016 9:11 AM MRN: [id number] Account #: [id number] Date of Birth: 28-Feb-1976 Admit Type: Inpatient Age: [age] [facility] Gender: Male Note Status: Finalized Procedure:            Upper GI endoscopy Indications:          Acute post hemorrhagic anemia Providers:            Midge Miniumarren Khaila Velarde MD, MD Referring MD:         Eustaquio BoydenJavier Gutierrez (Referring MD) Medicines:            Propofol per Anesthesia Complications:        No immediate complications. Procedure:            Pre-Anesthesia Assessment:                       - Prior to the procedure, a History and Physical was                        performed, and patient medications and allergies were                        reviewed. The patient's tolerance of previous                        anesthesia was also reviewed. The risks and benefits of                        the procedure and the sedation options and risks were                        discussed with the patient. All questions were                        answered, and informed consent was obtained. Prior                        Anticoagulants: The patient has taken no previous                        anticoagulant or antiplatelet agents. ASA Grade                        Assessment: II - A patient with mild systemic disease.                        After reviewing the risks and benefits, the patient was                        deemed in satisfactory condition to undergo the                        procedure.                       After obtaining informed consent, the endoscope was                        passed under direct vision. Throughout the procedure,  the patient's blood pressure, pulse, and oxygen                        saturations were monitored continuously. The Endoscope                        was introduced through the mouth, and advanced to the               second part of duodenum. The upper GI endoscopy was                        accomplished without difficulty. The patient tolerated                        the procedure well. Findings:      LA Grade A (one or more mucosal breaks less than 5 mm, not extending       between tops of 2 mucosal folds) esophagitis with no bleeding was found       at the gastroesophageal junction.      Localized mild inflammation characterized by erosions was found in the       gastric antrum.      Mild portal hypertensive gastropathy was found in the entire examined       stomach.      The examined duodenum was normal. Impression:           - LA Grade A esophagitis.                       - Gastritis.                       - Portal hypertensive gastropathy.                       - Normal examined duodenum.                       - No specimens collected. Recommendation:       - Return patient to hospital ward for ongoing care. Procedure Code(s):    --- Professional ---                       [phone], Esophagogastroduodenoscopy, flexible, transoral;                        diagnostic, including collection of specimen(s) by                        brushing or washing, when performed (separate procedure) Diagnosis Code(s):    --- Professional ---                       D62, Acute posthemorrhagic anemia                       K20.9, Esophagitis, unspecified                       K29.70, Gastritis, unspecified, without bleeding                       K76.6, Portal hypertension  K31.89, Other diseases of stomach and duodenum CPT copyright 2016 American Medical Association. All rights reserved. The codes documented in this report are preliminary and upon coder review may  be revised to meet current compliance requirements. [name] MD, MD 01/21/2016 9:24:28 AM This report has been signed electronically. Number of Addenda: 0 Note Initiated On: 01/22/2016 9:11 AM      [facility]

## 2016-02-01 NOTE — Transfer of Care (Signed)
Immediate Anesthesia Transfer of Care Note  Patient: Cameron Torres  Procedure(s) Performed: Procedure(s): ESOPHAGOGASTRODUODENOSCOPY (EGD) WITH PROPOFOL (N/A)  Patient Location: PACU  Anesthesia Type:General  Level of Consciousness: awake, alert  and oriented  Airway & Oxygen Therapy: Patient Spontanous Breathing and Patient connected to face mask oxygen  Post-op Assessment: Report given to RN and Post -op Vital signs reviewed and stable  Post vital signs: Reviewed and stable  Last Vitals:  Filed Vitals:   01/29/2016 0513 01/20/2016 0829  BP: 118/54 121/44  Pulse: 101 102  Temp: 37.6 C 38.6 C  Resp: 16 18    Last Pain:  Filed Vitals:   01/31/2016 0832  PainSc: 0-No pain         Complications: No apparent anesthesia complications

## 2016-02-01 NOTE — Progress Notes (Addendum)
Pharmacy Antibiotic Note  Marcene Brawnnthony W Myint is a 40 y.o. male admitted on 07/30/2015 with aspiration pneumonia and colitis.  Pharmacy has been consulted for Zosyn dosing.  Plan: Zosyn 3.375g IV q8h (4 hour infusion).  Height: 5\' 7"  (170.2 cm) Weight: 183 lb 4.8 oz (83.144 kg) IBW/kg (Calculated) : 66.1  Temp (24hrs), Avg:99.9 F (37.7 C), Min:98.6 F (37 C), Max:101.7 F (38.7 C)   Recent Labs Lab 04-03-16 1437 04-03-16 1518 01/31/16 0605 02/14/2016 0501  WBC  --  12.4* 9.1 11.2*  CREATININE 0.87  --  0.59* 0.76    Estimated Creatinine Clearance: 124 mL/min (by C-G formula based on Cr of 0.76).    No Known Allergies  Antimicrobials this admission: 7/16 levofloxacin >> 7/18 7/18 Zosyn >>    Microbiology results: 7/16 BCx: NG x 2   Thank you for allowing pharmacy to be a part of this patient's care.  Scarpena,Crystal G 01/31/2016 10:35 AM  02/02/2016 Zosyn changed to meropenem for clinical worsening.

## 2016-02-01 NOTE — Anesthesia Procedure Notes (Signed)
Procedure Name: Intubation Performed by: Cerys Winget Pre-anesthesia Checklist: Patient identified, Patient being monitored, Timeout performed, Emergency Drugs available and Suction available Patient Re-evaluated:Patient Re-evaluated prior to inductionOxygen Delivery Method: Circle system utilized Preoxygenation: Pre-oxygenation with 100% oxygen Intubation Type: IV induction, Rapid sequence and Cricoid Pressure applied Laryngoscope Size: Mac and 3 Grade View: Grade I Tube type: Oral Tube size: 7.0 mm Number of attempts: 1 Airway Equipment and Method: Stylet Placement Confirmation: ETT inserted through vocal cords under direct vision,  positive ETCO2 and breath sounds checked- equal and bilateral Secured at: 21 cm Tube secured with: Tape Dental Injury: Teeth and Oropharynx as per pre-operative assessment        

## 2016-02-02 ENCOUNTER — Encounter: Payer: Self-pay | Admitting: Gastroenterology

## 2016-02-02 ENCOUNTER — Inpatient Hospital Stay: Payer: Medicaid Other

## 2016-02-02 DIAGNOSIS — J80 Acute respiratory distress syndrome: Secondary | ICD-10-CM

## 2016-02-02 DIAGNOSIS — I959 Hypotension, unspecified: Secondary | ICD-10-CM

## 2016-02-02 DIAGNOSIS — Z452 Encounter for adjustment and management of vascular access device: Secondary | ICD-10-CM | POA: Insufficient documentation

## 2016-02-02 DIAGNOSIS — J9601 Acute respiratory failure with hypoxia: Secondary | ICD-10-CM

## 2016-02-02 LAB — COMPREHENSIVE METABOLIC PANEL
ALBUMIN: 2.7 g/dL — AB (ref 3.5–5.0)
ALK PHOS: 67 U/L (ref 38–126)
ALT: 36 U/L (ref 17–63)
AST: 85 U/L — AB (ref 15–41)
Anion gap: 8 (ref 5–15)
BUN: 12 mg/dL (ref 6–20)
CALCIUM: 8.4 mg/dL — AB (ref 8.9–10.3)
CHLORIDE: 100 mmol/L — AB (ref 101–111)
CO2: 24 mmol/L (ref 22–32)
CREATININE: 0.64 mg/dL (ref 0.61–1.24)
GFR calc non Af Amer: >60 mL/min (ref >60)
GLUCOSE: 111 mg/dL — AB (ref 65–99)
Potassium: 3.5 mmol/L (ref 3.5–5.1)
SODIUM: 132 mmol/L — AB (ref 135–145)
Total Bilirubin: 10.9 mg/dL — ABNORMAL HIGH (ref 0.3–1.2)
Total Protein: 7.6 g/dL (ref 6.5–8.1)

## 2016-02-02 LAB — TYPE AND SCREEN
ABO/RH(D): O POS
ANTIBODY SCREEN: NEGATIVE
UNIT DIVISION: 0
UNIT DIVISION: 0
Unit division: 0
Unit division: 0

## 2016-02-02 LAB — BLOOD GAS, ARTERIAL
ACID-BASE DEFICIT: 0.8 mmol/L (ref 0.0–2.0)
Acid-Base Excess: 1.2 mmol/L (ref 0.0–3.0)
Allens test (pass/fail): POSITIVE — AB
Allens test (pass/fail): POSITIVE — AB
BICARBONATE: 28.9 meq/L — AB (ref 21.0–28.0)
Bicarbonate: 27.5 mEq/L (ref 21.0–28.0)
FIO2: 1
FIO2: 0.8
LHR: 30 {breaths}/min
MECHVT: 400 mL
O2 SAT: 98.7 %
O2 Saturation: 83.3 %
PATIENT TEMPERATURE: 37
PCO2 ART: 81 mmHg — AB (ref 32.0–48.0)
PEEP/CPAP: 14 cmH2O
PH ART: 7.16 — AB (ref 7.350–7.450)
PO2 ART: 146 mmHg — AB (ref 83.0–108.0)
Patient temperature: 37
pCO2 arterial: 51 mmHg — ABNORMAL HIGH (ref 32.0–48.0)
pH, Arterial: 7.34 — ABNORMAL LOW (ref 7.350–7.450)
pO2, Arterial: 51 mmHg — ABNORMAL LOW (ref 83.0–108.0)

## 2016-02-02 LAB — CBC WITH DIFFERENTIAL/PLATELET
BASOS ABS: 0.1 10*3/uL (ref 0–0.1)
Basophils Relative: 1 %
EOS ABS: 0 10*3/uL (ref 0–0.7)
Eosinophils Relative: 0 %
HCT: 25.3 % — ABNORMAL LOW (ref 40.0–52.0)
HEMOGLOBIN: 8.9 g/dL — AB (ref 13.0–18.0)
LYMPHS ABS: 1.6 10*3/uL (ref 1.0–3.6)
Lymphocytes Relative: 10 %
MCH: 37.9 pg — AB (ref 26.0–34.0)
MCHC: 35.3 g/dL (ref 32.0–36.0)
MCV: 107.3 fL — ABNORMAL HIGH (ref 80.0–100.0)
Monocytes Absolute: 1.2 10*3/uL — ABNORMAL HIGH (ref 0.2–1.0)
Monocytes Relative: 7 %
Neutro Abs: 13.2 10*3/uL — ABNORMAL HIGH (ref 1.4–6.5)
PLATELETS: 86 10*3/uL — AB (ref 150–440)
RBC: 2.36 MIL/uL — AB (ref 4.40–5.90)
RDW: 24.7 % — ABNORMAL HIGH (ref 11.5–14.5)
WBC: 16.1 10*3/uL — AB (ref 3.8–10.6)

## 2016-02-02 LAB — PHOSPHORUS: PHOSPHORUS: 1.9 mg/dL — AB (ref 2.5–4.6)

## 2016-02-02 LAB — MRSA PCR SCREENING: MRSA by PCR: NEGATIVE

## 2016-02-02 LAB — MAGNESIUM: MAGNESIUM: 1.5 mg/dL — AB (ref 1.7–2.4)

## 2016-02-02 LAB — GLUCOSE, CAPILLARY: Glucose-Capillary: 128 mg/dL — ABNORMAL HIGH (ref 65–99)

## 2016-02-02 MED ORDER — FENTANYL 2500MCG IN NS 250ML (10MCG/ML) PREMIX INFUSION
25.0000 ug/h | INTRAVENOUS | Status: DC
  Administered 2016-02-02: 50 ug/h via INTRAVENOUS
  Administered 2016-02-03: 100 ug/h via INTRAVENOUS
  Filled 2016-02-02 (×2): qty 250

## 2016-02-02 MED ORDER — MIDAZOLAM HCL 2 MG/2ML IJ SOLN: 4.0000 mg | Freq: Once | INTRAMUSCULAR | Status: DC

## 2016-02-02 MED ORDER — FENTANYL CITRATE (PF) 100 MCG/2ML IJ SOLN
100.0000 ug | INTRAMUSCULAR | Status: DC | PRN
  Administered 2016-02-02: 50 ug via INTRAVENOUS
  Administered 2016-02-04 – 2016-02-05 (×6): 100 ug via INTRAVENOUS
  Filled 2016-02-02 (×5): qty 2

## 2016-02-02 MED ORDER — FENTANYL CITRATE (PF) 100 MCG/2ML IJ SOLN
100.0000 ug | INTRAMUSCULAR | Status: DC | PRN
  Filled 2016-02-02 (×2): qty 2

## 2016-02-02 MED ORDER — LORAZEPAM 2 MG/ML IJ SOLN
2.0000 mg | INTRAMUSCULAR | Status: DC | PRN
  Administered 2016-02-02: 3 mg via INTRAVENOUS
  Filled 2016-02-02: qty 2

## 2016-02-02 MED ORDER — FENTANYL CITRATE (PF) 100 MCG/2ML IJ SOLN
50.0000 ug | Freq: Once | INTRAMUSCULAR | Status: DC
  Filled 2016-02-02: qty 2

## 2016-02-02 MED ORDER — LORAZEPAM 2 MG/ML IJ SOLN
1.0000 mg | Freq: Once | INTRAMUSCULAR | Status: AC
  Administered 2016-02-02 (×2): 1 mg via INTRAVENOUS

## 2016-02-02 MED ORDER — DEXMEDETOMIDINE HCL IN NACL 400 MCG/100ML IV SOLN
0.4000 ug/kg/h | INTRAVENOUS | Status: DC
  Administered 2016-02-02: 0.4 ug/kg/h via INTRAVENOUS
  Filled 2016-02-02: qty 100

## 2016-02-02 MED ORDER — KCL-LACTATED RINGERS-D5W 20 MEQ/L IV SOLN
INTRAVENOUS | Status: DC
  Administered 2016-02-02 – 2016-02-04 (×4): via INTRAVENOUS
  Filled 2016-02-02 (×6): qty 1000

## 2016-02-02 MED ORDER — VECURONIUM BROMIDE 10 MG IV SOLR
10.0000 mg | Freq: Once | INTRAVENOUS | Status: AC
  Administered 2016-02-02: 10 mg via INTRAVENOUS

## 2016-02-02 MED ORDER — FENTANYL CITRATE (PF) 100 MCG/2ML IJ SOLN: 50.0000 ug | Freq: Once | INTRAMUSCULAR | Status: DC

## 2016-02-02 MED ORDER — SODIUM CHLORIDE 0.9 % IV SOLN
0.0000 mg/h | INTRAVENOUS | Status: DC
  Administered 2016-02-02: 2 mg/h via INTRAVENOUS
  Administered 2016-02-03: 3 mg/h via INTRAVENOUS
  Administered 2016-02-03: 4 mg/h via INTRAVENOUS
  Administered 2016-02-05: 9 mg/h via INTRAVENOUS
  Filled 2016-02-02 (×5): qty 10

## 2016-02-02 MED ORDER — MIDAZOLAM BOLUS VIA INFUSION
1.0000 mg | INTRAVENOUS | Status: DC | PRN
  Administered 2016-02-04: 1 mg via INTRAVENOUS
  Administered 2016-02-05: 2 mg via INTRAVENOUS
  Filled 2016-02-02 (×3): qty 2

## 2016-02-02 MED ORDER — MAGNESIUM SULFATE 2 GM/50ML IV SOLN
2.0000 g | Freq: Once | INTRAVENOUS | Status: AC
  Administered 2016-02-02: 2 g via INTRAVENOUS
  Filled 2016-02-02: qty 50

## 2016-02-02 MED ORDER — MIDAZOLAM HCL 2 MG/2ML IJ SOLN
INTRAMUSCULAR | Status: AC
  Administered 2016-02-02: 2 mg
  Filled 2016-02-02: qty 4

## 2016-02-02 MED ORDER — FENTANYL CITRATE (PF) 100 MCG/2ML IJ SOLN
INTRAMUSCULAR | Status: AC
  Administered 2016-02-02: 07:00:00
  Filled 2016-02-02: qty 2

## 2016-02-02 MED ORDER — FUROSEMIDE 10 MG/ML IJ SOLN
40.0000 mg | Freq: Once | INTRAMUSCULAR | Status: AC
  Administered 2016-02-02: 40 mg via INTRAVENOUS

## 2016-02-02 MED ORDER — FUROSEMIDE 10 MG/ML IJ SOLN
INTRAMUSCULAR | Status: AC
  Administered 2016-02-02: 40 mg via INTRAVENOUS
  Filled 2016-02-02: qty 4

## 2016-02-02 MED ORDER — SODIUM CHLORIDE 0.9 % IV SOLN
1.0000 g | Freq: Three times a day (TID) | INTRAVENOUS | Status: DC
  Administered 2016-02-02 – 2016-02-05 (×10): 1 g via INTRAVENOUS
  Filled 2016-02-02 (×13): qty 1

## 2016-02-02 MED ORDER — CHLORHEXIDINE GLUCONATE 0.12% ORAL RINSE (MEDLINE KIT)
15.0000 mL | Freq: Two times a day (BID) | OROMUCOSAL | Status: DC
  Administered 2016-02-02 – 2016-02-11 (×19): 15 mL via OROMUCOSAL
  Filled 2016-02-02 (×19): qty 15

## 2016-02-02 MED ORDER — VECURONIUM BROMIDE 10 MG IV SOLR: 8.0000 mg | Freq: Once | INTRAVENOUS | Status: DC

## 2016-02-02 MED ORDER — SODIUM PHOSPHATES 45 MMOLE/15ML IV SOLN
20.0000 mmol | Freq: Once | INTRAVENOUS | Status: AC
  Administered 2016-02-02: 20 mmol via INTRAVENOUS
  Filled 2016-02-02: qty 6.67

## 2016-02-02 MED ORDER — FENTANYL CITRATE (PF) 100 MCG/2ML IJ SOLN: 100.0000 ug | Freq: Once | INTRAMUSCULAR | Status: DC

## 2016-02-02 MED ORDER — NOREPINEPHRINE BITARTRATE 1 MG/ML IV SOLN
0.0000 ug/min | INTRAVENOUS | Status: DC
  Administered 2016-02-02: 2 ug/min via INTRAVENOUS
  Administered 2016-02-02: 35 ug/min via INTRAVENOUS
  Administered 2016-02-03: 20 ug/min via INTRAVENOUS
  Administered 2016-02-04: 10 ug/min via INTRAVENOUS
  Administered 2016-02-05: 2 ug/min via INTRAVENOUS
  Filled 2016-02-02 (×6): qty 16

## 2016-02-02 MED ORDER — FENTANYL BOLUS VIA INFUSION
50.0000 ug | INTRAVENOUS | Status: DC | PRN
  Filled 2016-02-02: qty 50

## 2016-02-02 MED ORDER — VECURONIUM BROMIDE 10 MG IV SOLR
INTRAVENOUS | Status: AC
  Administered 2016-02-02: 10 mg
  Filled 2016-02-02: qty 10

## 2016-02-02 MED ORDER — MIDAZOLAM HCL 2 MG/2ML IJ SOLN
INTRAMUSCULAR | Status: AC
  Administered 2016-02-02: 07:00:00
  Filled 2016-02-02: qty 4

## 2016-02-02 MED ORDER — SODIUM CHLORIDE 0.9 % IV SOLN
INTRAVENOUS | Status: DC
  Administered 2016-02-02: 08:00:00 via INTRAVENOUS

## 2016-02-02 MED ORDER — IPRATROPIUM-ALBUTEROL 0.5-2.5 (3) MG/3ML IN SOLN
3.0000 mL | Freq: Four times a day (QID) | RESPIRATORY_TRACT | Status: DC
  Administered 2016-02-02 – 2016-02-11 (×37): 3 mL via RESPIRATORY_TRACT
  Filled 2016-02-02 (×37): qty 3

## 2016-02-02 MED ORDER — SODIUM CHLORIDE 0.9 % IV SOLN
0.0300 [IU]/min | INTRAVENOUS | Status: DC
  Administered 2016-02-02 – 2016-02-03 (×2): 0.03 [IU]/min via INTRAVENOUS
  Filled 2016-02-02 (×3): qty 2

## 2016-02-02 MED ORDER — FAMOTIDINE IN NACL 20-0.9 MG/50ML-% IV SOLN
20.0000 mg | Freq: Two times a day (BID) | INTRAVENOUS | Status: DC
  Administered 2016-02-02 (×2): 20 mg via INTRAVENOUS
  Filled 2016-02-02 (×5): qty 50

## 2016-02-02 MED ORDER — DOCUSATE SODIUM 50 MG/5ML PO LIQD: 100.0000 mg | Freq: Two times a day (BID) | ORAL | Status: DC | PRN

## 2016-02-02 MED ORDER — ETOMIDATE 2 MG/ML IV SOLN
0.3000 mg/kg | Freq: Once | INTRAVENOUS | Status: AC
  Administered 2016-02-02: 24.94 mg via INTRAVENOUS

## 2016-02-02 MED ORDER — LORAZEPAM 2 MG/ML IJ SOLN
INTRAMUSCULAR | Status: AC
  Administered 2016-02-02: 1 mg via INTRAVENOUS
  Filled 2016-02-02: qty 1

## 2016-02-02 MED ORDER — VECURONIUM BROMIDE 10 MG IV SOLR: 10.0000 mg | INTRAVENOUS | Status: DC | PRN

## 2016-02-02 MED ORDER — ANTISEPTIC ORAL RINSE SOLUTION (CORINZ)
7.0000 mL | Freq: Four times a day (QID) | OROMUCOSAL | Status: DC
  Administered 2016-02-02 – 2016-02-04 (×11): 7 mL via OROMUCOSAL
  Filled 2016-02-02 (×10): qty 7

## 2016-02-02 NOTE — Procedures (Signed)
PROCEDURE NOTE: [place] CVL PLACEMENT  INDICATION:    Monitoring of central venous pressures and/or administration of medications optimally administered in central vein  CONSENT:   Performed as emergent procedure  PROCEDURE  Sterile technique was used including antiseptics, cap, gloves, gown, hand hygiene, mask and full body sheet.  Skin prep: Chlorhexidine; local anesthetic administered  A triple lumen catheter was placed in the L [place] vein using the Seldinger technique.   EVALUATION:  Blood flow good  Complications: No apparent complications  Patient tolerated the procedure well.  Chest X-ray revealed proper position and no ptx   Cameron Fischeravid Julius Matus, MD PCCM service Mobile [phone]

## 2016-02-02 NOTE — Progress Notes (Signed)
Initially called about cough productive of red/orange sputum.  Patient increased to supplemental O2 by NRB mask. Jaundice and sputum concerning for possible Klebsiella pneumonia.  Called pharmacy for advice re: Zosyn spectrum.  Advised to broaden coverage due to prevalence of ESBL organisms. Transferred to ICU for respiratory monitoring.  More agitation after moving to ICU.  Initiate CIWA.  Start Precedex drip.  CXR and ABG ordered.

## 2016-02-02 NOTE — Progress Notes (Signed)
Pharmacy Antibiotic Note  Cameron Torres is a 40 y.o. male admitted on 01/29/2016 with aspiration pneumonia and colitis.  Pharmacy has been consulted for Zosyn dosing. Abx changed to meropenem 7/19 to cover ESBL organisms per MD  Plan: Continue meropenem 1 g iv q 8 hours.   Height: 5\' 7"  (170.2 cm) Weight: 183 lb 4.8 oz (83.144 kg) IBW/kg (Calculated) : 66.1  Temp (24hrs), Avg:99.6 F (37.6 C), Min:98.5 F (36.9 C), Max:102.1 F (38.9 C)   Recent Labs Lab 02/05/2016 1437 01/18/2016 1518 01/31/16 0605 2015-11-10 0501 02/02/16 0320  WBC  --  12.4* 9.1 11.2* 16.1*  CREATININE 0.87  --  0.59* 0.76 0.64    Estimated Creatinine Clearance: 124 mL/min (by C-G formula based on Cr of 0.64).    No Known Allergies  Antimicrobials this admission: 7/16 levofloxacin >> 7/18 7/18 Zosyn >> 7/18 Meropenem 7/19 >>  Microbiology results: 7/16 BCx: NG x 2 7/19 MRSA PCR: negative 7/19 TA: pending   Thank you for allowing pharmacy to be a part of this patient's care.  Luisa HartChristy, Noelia Lenart D 02/02/2016 10:37 AM  02/02/2016 Zosyn changed to meropenem for clinical worsening.

## 2016-02-02 NOTE — Progress Notes (Signed)
Subjective: Called to assess patient for respiratory distress. Patient producing orange frothy sputum and on nonrebreather mask. Transfer to the ICU for further management.  Objective: Vital signs in last 24 hours: Temp:  [98.5 F (36.9 C)-101.7 F (38.7 C)] 100.1 F (37.8 C) (07/19 0428) Pulse Rate:  [91-122] 122 (07/19 0328) Resp:  [16-32] 20 (07/18 1509) BP: (96-144)/(44-79) 144/79 mmHg (07/19 0428) SpO2:  [77 %-99 %] 96 % (07/19 0515) Weight change:  Last BM Date: 02/09/2016 (per pt, two small BMs)  Intake/Output from previous day: 07/18 0701 - 07/19 0700 In: 2256.3 [P.O.:340; I.V.:1476.3; Blood:340; IV Piggyback:100] Out: 200 [Urine:200] Intake/Output this shift: Total I/O In: 700 [I.V.:600; IV Piggyback:100] Out: 200 [Urine:200]  Physical Exam  Constitutional: He is oriented to person, place, and time. He appears well-developed and well-nourished. He appears distressed.  HENT:  Head: Normocephalic and atraumatic.  Mouth/Throat: Oropharynx is clear and moist.  Eyes: Conjunctivae and EOM are normal. Pupils are equal, round, and reactive to light. Scleral icterus is present.  Neck: Normal range of motion. Neck supple. No JVD present. No tracheal deviation present. No thyromegaly present.  Cardiovascular: Regular rhythm and normal heart sounds.  Tachycardia present.  Exam reveals no gallop and no friction rub.   No murmur heard. Pulmonary/Chest: Tachypnea noted. He is in respiratory distress. He has rales.  Abdominal: Soft. Bowel sounds are normal. He exhibits distension. He exhibits no mass. There is no tenderness. There is no rebound and no guarding.  Genitourinary: Penis normal.  Musculoskeletal: Normal range of motion. He exhibits no tenderness.  Lymphadenopathy:    He has no cervical adenopathy.  Neurological: He is alert and oriented to person, place, and time. No cranial nerve deficit.  Skin: Skin is warm. He is diaphoretic.  Jaundiced  Psychiatric: He has a normal  mood and affect. Judgment and thought content normal. He is agitated.     Lab Results:  Recent Labs  01/23/2016 0501 02/02/2016 1651 02/02/16 0320  WBC 11.2*  --  16.1*  HGB 6.9* 7.9* 8.9*  HCT 19.5* 22.6* 25.3*  PLT 66*  --  PENDING   BMET  Recent Labs  01/25/2016 0501 02/02/16 0320  NA 129* 132*  K 3.2* 3.5  CL 96* 100*  CO2 24 24  GLUCOSE 94 111*  BUN 12 12  CREATININE 0.76 0.64  CALCIUM 8.2* 8.4*    Studies/Results: Dg Chest Port 1 View  02/02/2016  CLINICAL DATA:  Cough EXAM: PORTABLE CHEST 1 VIEW COMPARISON:  Chest CT from 3 days ago FINDINGS: Marked progression of bilateral airspace disease, diffuse but patchy. Normal heart size. No effusion or air leak. Linear interfaces over the left chest appear artifactual and external. IMPRESSION: Marked progression of pneumonia seen on chest CT 3 days ago. Question superimposed ARDS. Electronically Signed   By: Marnee Spring M.D.   On: 02/02/2016 05:25    Medications: I have reviewed the patient's current medications. WUJ:[id number] **OR** acetaminophen, fentaNYL, ipratropium-albuterol, LORazepam, ondansetron **OR** ondansetron (ZOFRAN) IV, zolpidem  Assessment/Plan: This is a 40 year old male transferred from the floor to ICU for respiratory failure. 1. Respiratory failure: Acute; secondary to pneumonia. Likely aspiration. Need to cover Klebsiella. Broadened antibiotic spectrum for ESBL organisms. Pharmacy to dose meropenem. ABG and x-ray consistent with ARDS. Patient had been transitioned to BiPAP will likely need intubation. Following decrease in respiratory drive and tidal volume patient was intubated without complication. 2. Delirium tremens: Patient started on CIWA scale. Now that he is intubated and mechanically ventilated we will continue  Precedex drip as well as fentanyl and/or Ativan as needed. 3. DVT prophylaxis: SCDs for now. I will check an INR to make sure the patient does not have loss of synthetic function. EGD  showed no gastric bleeds. If no coagulopathy then start heparin or Lovenox. 4. GI prophylaxis: Pantoprazole IV every 12 hours The patient is a full code. Time spent on admission orders and critical patient care approximately 90 minutes  LOS: 3 days   Arnaldo NatalDiamond,  Claudie Brickhouse S 02/02/2016, 5:42 AM

## 2016-02-02 NOTE — Progress Notes (Signed)
eLink Physician-Brief Progress Note Patient Name: Cameron Torres DOB: 19-Jul-1975 MRN: [id number]   Date of Service  02/02/2016  HPI/Events of Note  Acute respiratory failure, new arrival from floor; has aspiration pneumonia. CXR shows bilateral airspace disease; on camera check: breathing 40-50x/min on BIPAP.  eICU Interventions  Discussed with on call physician with PCCM, they are on the way in. Given the severity of his respiratory failure Cameron Torres needs immediate intubation.  Have asked the on call hospitalist to come push drugs for rapid sequence intubation.     Intervention Category Major Interventions: Respiratory failure - evaluation and management  Max FickleDouglas Zada Haser 02/02/2016, 6:45 AM

## 2016-02-02 NOTE — Progress Notes (Signed)
Pt arrived to ICU from 1-C approx 0430.  Pt on non-re-breather upon arrival with sats in  Mid 80's.  Initially appeared to be A&O but quickly became disoriented and agitated, pulling at telemetry leads, BP cuff etc  . Pt received 4 mg ativan and started on precedex.  Pt now mildly sedated, on bipap and sats are in upper 90's.  Will continue to monitor.

## 2016-02-02 NOTE — Progress Notes (Signed)
Pt is currently synchronous on the ventilator at 70% FiO2/ PEEP 12/ RR 35/ VT 510, sats 99%. Small amount of orange/yellow, thin secretions are suctioned via ETT catheter. OG to LIS. Before CVL placement pt was restless and able to reposition self on side with a RASS of +2. Pt is currently RASS of -4 on minimal sedation. Fent 25 mcgs, Versed 2mg /ml. No response to pain. Pupils are a 2 with slight, sluggish reaction to light. Pt has required Levophed currently at 38 mcgs and vasopressin at 0.03 units/min to maintain a MAP > 65.  Pt has required a cooling pad for a Tmax of 102.8 F. Temp is currently 98.4 F per rectal probe and is on standby/monitor via cooling pad.  Urine output has decreased since this am and has changed from yellow/straw to a dark amber. Pt is subjectively jaundiced. Will continue to monitor.

## 2016-02-02 NOTE — Care Management (Signed)
RNCM will need to continue to follow. Patient does not have health insurance. He is currently intubated. Message left with patient's mother Ms. Don BroachCalvert [phone] to discuss living arrangements and any RNCM assistance that may be needed. RNCM will continue to follow.

## 2016-02-02 NOTE — Consult Note (Signed)
PULMONARY / CRITICAL CARE MEDICINE   Name: Cameron Torres MRN: [id number] DOB: 06/08/1976    ADMISSION DATE:  02/07/2016 CONSULTATION DATE:  02/02/16  REFERRING MD: Sheryle Hailiamond  MAJOR EVENTS/TEST RESULTS: 07/16 Admitted to med-surg floor, hospitalist service with history of alcohol abuse, intractable N/V X 2-3 days, jaundice, alcoholic hepatitis, severe anemia (Hgb 6.5), electrolyte derangements 07/16 RUQ US: Indeterminate 3.9 cm masslike area located just inferior to the sternum with internal vascularity. While this may potentially represent a mass emanating from the liver, additional etiologies are not excluded and this is indeterminate on ultrasound. This needs dedicated evaluation with contrast-enhanced CT. Hepatic steatosis. Mild nodularity of the liver raising the possibility of cirrhosis 07/16 CTAP: Morphologic changes to the liver suggestive of cirrhosis. Additionally there are findings compatible with portal venous hypertension including marked splenomegaly and re- cannulization of the periumbilical vein. Small amount of ascites within the paracolic gutters and central upper abdomen 07/16 CT chest: Tree-in-bud ground-glass opacities within the superior segment left lower lobe concerning for atypical infectious process. Multiple additional larger ground-glass opacities throughout the lungs bilaterally measuring up to 2.1 cm. 07/16 Transfused one unit PRBCs for Hgb 6.5 07/17 Transfused two units PRBCs for Hgb 5.8 07/18 Transfused one unit PRBCs for Hgb 6.9 07/19 Progressive hypoxemia. Transferred to ICU and intubated. CXR c/w severe ARDS. Required heavy sedation and intermittent NMBs for vent synchrony. Low BP > norepinephrine initiated  INDWELLING DEVICES:: ETT 07/19 >>  L Jobos CVL 07/19 >>   MICRO DATA: Blood 07/16 >>  Resp 07/19 >>     ANTIMICROBIALS:  Levofloxacin 07/16 >> 07/18 Pip-tazo 07/18 >> 07/19 Meropenem 07/19 >>   HISTORY OF PRESENT ILLNESS:   Level 5 caveat - pt  intubated @ the time of my initial evaluation  PAST MEDICAL HISTORY :  He  has a past medical history of Depression; OCD (obsessive compulsive disorder); Headache(784.0); LBP (low back pain); and Alcohol abuse.  PAST SURGICAL HISTORY: He  has past surgical history that includes Esophagogastroduodenoscopy (egd) with propofol (N/A, 01/31/2016).  No Known Allergies  No current facility-administered medications on file prior to encounter.   Current Outpatient Prescriptions on File Prior to Encounter  Medication Sig  . naproxen (NAPROSYN) 500 MG tablet Take one po bid x 1 week then prn pain, take with food    FAMILY HISTORY:  His has no family status information on file.   SOCIAL HISTORY: He  reports that he has been smoking.  He does not have any smokeless tobacco history on file. He reports that he drinks alcohol. He reports that he does not use illicit drugs.  REVIEW OF SYSTEMS:   Level 5 caveat  SUBJECTIVE:    VITAL SIGNS: BP 144/79 mmHg  Pulse 122  Temp(Src) 102.1 F (38.9 C) (Axillary)  Resp 20  Ht 5\' 7"  (1.702 m)  Wt 183 lb 4.8 oz (83.144 kg)  BMI 28.70 kg/m2  SpO2 97%  HEMODYNAMICS:    VENTILATOR SETTINGS: Vent Mode:  [-] PRVC FiO2 (%):  [60 %-100 %] 70 % Set Rate:  [16 bmp-35 bmp] 35 bmp Vt Set:  [400 mL-510 mL] 510 mL PEEP:  [5 cmH20-14 cmH20] 14 cmH20  INTAKE / OUTPUT: I/O last 3 completed shifts: In: 2846.3 [P.O.:340; I.V.:1476.3; Blood:930; IV Piggyback:100] Out: 200 [Urine:200]  PHYSICAL EXAMINATION: General: Sedated, intubated, RASS -2 Neuro: CNs intact, MAEs, DTRs symmetric HEENT: Scleral icterus Cardiovascular: Tachy, regular, no M Lungs: diffuse extensive rhonchi Abdomen: obese, soft, diminished BS, not overtly tender Ext: warm, no edema  Skin: severe jaundice, innumerable telengectasias  LABS:  BMET  Recent Labs Lab 01/31/16 0605 01/31/16 2156 02/09/2016 0501 02/02/16 0320  NA 129*  --  129* 132*  K 2.5* 3.2* 3.2* 3.5  CL 95*  --   96* 100*  CO2 27  --  24 24  BUN 21*  --  12 12  CREATININE 0.59*  --  0.76 0.64  GLUCOSE 91  --  94 111*    Electrolytes  Recent Labs Lab 01/31/16 0605 01/31/16 2156 01/31/2016 0501 02/02/16 0320  CALCIUM 8.1*  --  8.2* 8.4*  MG 1.2* 2.1 1.8 1.5*  PHOS  --   --  UNABLE TO REPORT DUE TO ICTERUS 1.9*    CBC  Recent Labs Lab 01/31/16 0605 01/23/2016 0501 01/15/2016 1651 02/02/16 0320  WBC 9.1 11.2*  --  16.1*  HGB 5.8* 6.9* 7.9* 8.9*  HCT 16.3* 19.5* 22.6* 25.3*  PLT 66* 66*  --  86*    Coag's  Recent Labs Lab 01/15/2016 1437  INR 1.92    Sepsis Markers No results for input(s): LATICACIDVEN, PROCALCITON, O2SATVEN in the last 168 hours.  ABG  Recent Labs Lab 02/02/16 0451 02/02/16 1024  PHART 7.34* 7.16*  PCO2ART 51* 81*  PO2ART 51* 146*    Liver Enzymes  Recent Labs Lab 01/31/16 0605 01/28/2016 0501 02/02/16 0320  AST 97* 93* 85*  ALT 28 32 36  ALKPHOS 59 55 67  BILITOT 12.2* 12.5* 10.9*  ALBUMIN 2.5* 2.7* 2.7*    Cardiac Enzymes No results for input(s): TROPONINI, PROBNP in the last 168 hours.  Glucose  Recent Labs Lab 02/02/16 0429  GLUCAP 128*    CXR: very severe ARDS pattern    ASSESSMENT / PLAN:  PULMONARY A: Acute hypoxic respiratory failure Suspected aspiration pneumonitis Severe ARDS Ventilator dyssynchrony P:   Vent settings established - ARDS protocol  Vent bundle implemented Daily SBT as indicated Empiric nebulized bronchodilators PRN vecuronium  CARDIOVASCULAR A:  Hypotension - appears to be mostly due to sedation/mechanical ventilation. 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 Concern for UGIB Thrombocytopenia Coagulopathy due to liver failure P:  DVT px: SCDs Monitor CBC intermittently Transfuse per usual guidelines (for Hgb < 7.0)  INFECTIOUS A:   Severe sepsis Aspiration PNA High fever P:   Monitor temp, WBC count Micro and abx as above No acetaminophen due to severe liver failure  ENDOCRINE A:   No issues noted P:   Maintain glu 70-180  NEUROLOGIC A:   Acute encephalopathy Alcohol withdrawal syndrome P:   RASS goal: -2,-3 Midaz gtt PRN fentanyl    FAMILY: Mother updated @ bedside  CCM time: 60 mins The above time includes time spent in consultation with patient and/or family members and reviewing care plan on multidisciplinary rounds  Billy Fischer, MD PCCM service Mobile [phone] Pager [phone]     02/02/2016, 11:58 AM

## 2016-02-02 NOTE — Progress Notes (Signed)
Pt lung sounds have been Rhonchi, off/on 2L O2. Around 0115, pt cough worsened, HR jumped to 110s. Sputum remains orange tinged and in copious amts. Sats were 82. I increase O2 to 5L and called Respiratory. RT initiated IS. Pt Sats came up 93 on 5L. Pt feeling really congested.

## 2016-02-02 NOTE — Progress Notes (Signed)
MEDICATION RELATED CONSULT NOTE - INITIAL   Pharmacy Consult for electrolyte replacement Indication: hypokalemia  No Known Allergies  Patient Measurements: Height:  (170.2 cm) Weight: 183 lb 4.8 oz (83.144 kg) IBW/kg (Calculated) : 66.1 Adjusted Body Weight:   Vital Signs: Temp: 100.1 F (37.8 C) (07/19 0428) Temp Source: Axillary (07/19 0428) BP: 144/79 mmHg (07/19 0428) Pulse Rate: 122 (07/19 0328) Intake/Output from previous day: 07/18 0701 - 07/19 0700 In: 2256.3 [P.O.:340; I.V.:1476.3; Blood:340; IV Piggyback:100] Out: 200 [Urine:200] Intake/Output from this shift:    Labs:  Recent Labs  February 21, 2016 1437  01/31/16 0605 01/31/16 2156 01/26/2016 0501 02/04/2016 1651 02/02/16 0320  WBC  --   < > 9.1  --  11.2*  --  16.1*  HGB  --   < > 5.8*  --  6.9* 7.9* 8.9*  HCT  --   < > 16.3*  --  19.5* 22.6* 25.3*  PLT  --   < > 66*  --  66*  --  86*  CREATININE 0.87  --  0.59*  --  0.76  --  0.64  MG 1.5*  --  1.2* 2.1 1.8  --  1.5*  PHOS  --   --   --   --  UNABLE TO REPORT DUE TO ICTERUS  --  1.9*  ALBUMIN 3.2*  --  2.5*  --  2.7*  --  2.7*  PROT 8.6*  --  6.7  --  7.4  --  7.6  AST 129*  --  97*  --  93*  --  85*  ALT 34  --  28  --  32  --  36  ALKPHOS 75  --  59  --  55  --  67  BILITOT 15.2*  --  12.2*  --  12.5*  --  10.9*  BILIDIR 3.7*  --  3.7*  --   --   --   --   IBILI  --   --  8.5*  --   --   --   --   < > = values in this interval not displayed. Estimated Creatinine Clearance: 124 mL/min (by C-G formula based on Cr of 0.64).   Microbiology: Recent Results (from the past 720 hour(s))  Blood culture (routine x 2)     Status: None (Preliminary result)   Collection Time: 02-21-16  7:39 PM  Result Value Ref Range Status   Specimen Description BLOOD LEFT ASSIST CONTROL  Final   Special Requests BOTTLES DRAWN AEROBIC AND ANAEROBIC 5CCAERO,3CCANA  Final   Culture NO GROWTH 2 DAYS  Final   Report Status PENDING  Incomplete  Blood culture (routine x 2)      Status: None (Preliminary result)   Collection Time: 02-21-16  8:00 PM  Result Value Ref Range Status   Specimen Description BLOOD RIGHT FOREARM  Final   Special Requests BOTTLES DRAWN AEROBIC AND ANAEROBIC 5CCAERO,3CCANA  Final   Culture NO GROWTH 2 DAYS  Final   Report Status PENDING  Incomplete  MRSA PCR Screening     Status: None   Collection Time: 02/02/16  5:08 AM  Result Value Ref Range Status   MRSA by PCR NEGATIVE NEGATIVE Final    Comment:        The GeneXpert MRSA Assay (FDA approved for NASAL specimens only), is one component of a comprehensive MRSA colonization surveillance program. It is not intended to diagnose MRSA infection nor to guide or monitor treatment for  MRSA infections.     Medical History: Past Medical History  Diagnosis Date  . Depression   . OCD (obsessive compulsive disorder)   . Headache(784.0)   . LBP (low back pain)   . Alcohol abuse     Medications:  Infusions:  . sodium chloride 999 mL/hr at 02/02/16 0808  . dextrose 5% lactated ringers with KCl 20 mEq/L    . fentaNYL infusion INTRAVENOUS Stopped (02/02/16 0757)  . midazolam (VERSED) infusion      Assessment: 40 yom cc emesis and history of alcohol abuse presents with intractable N/V. Pharmacy consulted to replace electrolytes.  Goal of Therapy:  WNL  Plan:  K= 3.5, Mag= 1.5, Phos= 1.9  Will replace with magnesium sulfate 2 g iv once and sodium phosphate 20 mmol iv once. Consult d/c. Pharmacy will sign off now. Thank you for the consult.   Luisa Harthristy, Avri Paiva D, Pharm.D., BCPS Clinical Pharmacist 02/02/2016,8:37 AM

## 2016-02-03 ENCOUNTER — Inpatient Hospital Stay: Payer: Medicaid Other

## 2016-02-03 DIAGNOSIS — R579 Shock, unspecified: Secondary | ICD-10-CM

## 2016-02-03 LAB — CBC
HEMATOCRIT: 23.8 % — AB (ref 40.0–52.0)
HEMOGLOBIN: 8.3 g/dL — AB (ref 13.0–18.0)
MCH: 37.5 pg — ABNORMAL HIGH (ref 26.0–34.0)
MCHC: 34.8 g/dL (ref 32.0–36.0)
MCV: 108 fL — AB (ref 80.0–100.0)
Platelets: 102 10*3/uL — ABNORMAL LOW (ref 150–440)
RBC: 2.21 MIL/uL — AB (ref 4.40–5.90)
RDW: 26.5 % — ABNORMAL HIGH (ref 11.5–14.5)
WBC: 12.2 10*3/uL — AB (ref 3.8–10.6)

## 2016-02-03 LAB — COMPREHENSIVE METABOLIC PANEL
ALT: 35 U/L (ref 17–63)
ANION GAP: 6 (ref 5–15)
AST: 72 U/L — AB (ref 15–41)
Albumin: 2.2 g/dL — ABNORMAL LOW (ref 3.5–5.0)
Alkaline Phosphatase: 45 U/L (ref 38–126)
BUN: 23 mg/dL — AB (ref 6–20)
CHLORIDE: 102 mmol/L (ref 101–111)
CO2: 27 mmol/L (ref 22–32)
Calcium: 7.9 mg/dL — ABNORMAL LOW (ref 8.9–10.3)
Creatinine, Ser: 0.8 mg/dL (ref 0.61–1.24)
Glucose, Bld: 152 mg/dL — ABNORMAL HIGH (ref 65–99)
POTASSIUM: 3.6 mmol/L (ref 3.5–5.1)
Sodium: 135 mmol/L (ref 135–145)
TOTAL PROTEIN: 6.6 g/dL (ref 6.5–8.1)
Total Bilirubin: 10.3 mg/dL — ABNORMAL HIGH (ref 0.3–1.2)

## 2016-02-03 LAB — GLUCOSE, CAPILLARY
GLUCOSE-CAPILLARY: 145 mg/dL — AB (ref 65–99)
GLUCOSE-CAPILLARY: 128 mg/dL — AB (ref 65–99)
Glucose-Capillary: 140 mg/dL — ABNORMAL HIGH (ref 65–99)
Glucose-Capillary: 140 mg/dL — ABNORMAL HIGH (ref 65–99)

## 2016-02-03 LAB — CORTISOL: Cortisol, Plasma: 20.7 ug/dL

## 2016-02-03 LAB — PROCALCITONIN: PROCALCITONIN: 11.3 ng/mL

## 2016-02-03 LAB — MAGNESIUM: MAGNESIUM: 1.8 mg/dL (ref 1.7–2.4)

## 2016-02-03 MED ORDER — CYANOCOBALAMIN 1000 MCG/ML IJ SOLN
1000.0000 ug | Freq: Once | INTRAMUSCULAR | Status: AC
  Administered 2016-02-03: 1000 ug via INTRAMUSCULAR
  Filled 2016-02-03: qty 1

## 2016-02-03 MED ORDER — VITAL HIGH PROTEIN PO LIQD: 1000.0000 mL | ORAL | Status: DC

## 2016-02-03 MED ORDER — PRO-STAT SUGAR FREE PO LIQD
30.0000 mL | Freq: Two times a day (BID) | ORAL | Status: DC
  Administered 2016-02-03 – 2016-02-08 (×10): 30 mL

## 2016-02-03 MED ORDER — DEXTROSE 50 % IV SOLN
25.0000 mL | Freq: Once | INTRAVENOUS | Status: AC
  Administered 2016-02-03: 25 mL via INTRAVENOUS

## 2016-02-03 MED ORDER — ADULT MULTIVITAMIN LIQUID CH
15.0000 mL | Freq: Every day | ORAL | Status: DC
  Administered 2016-02-03 – 2016-02-10 (×8): 15 mL
  Filled 2016-02-03 (×9): qty 15

## 2016-02-03 MED ORDER — FOLIC ACID 5 MG/ML IJ SOLN
1.0000 mg | Freq: Every day | INTRAMUSCULAR | Status: DC
  Administered 2016-02-03 – 2016-02-04 (×2): 1 mg via INTRAVENOUS
  Filled 2016-02-03 (×3): qty 0.2

## 2016-02-03 MED ORDER — FAMOTIDINE 40 MG/5ML PO SUSR
20.0000 mg | Freq: Two times a day (BID) | ORAL | Status: DC
  Administered 2016-02-03 – 2016-02-10 (×15): 20 mg
  Filled 2016-02-03 (×16): qty 2.5

## 2016-02-03 MED ORDER — VITAL AF 1.2 CAL PO LIQD
1000.0000 mL | ORAL | Status: DC
  Administered 2016-02-03 – 2016-02-04 (×2): 1000 mL

## 2016-02-03 NOTE — Progress Notes (Signed)
Initial Nutrition Assessment  DOCUMENTATION CODES:   Not applicable  INTERVENTION:  EN: dietitian consult received for enteral nutrition management via adult tube feeding protocol. Nutritional needs assessed; recommend changing order to Vital AF 1.2 at goal of 65 ml/hr with Prostat BID providing 2072 kcals, 147 g of protein and 1264 mL of fluid. PEPuP protocol initiated as well. Pt receiving additional calories from D5 infusion at present as well. Continue to assess   NUTRITION DIAGNOSIS:   Inadequate oral intake related to acute illness as evidenced by NPO status.  GOAL:   Provide needs based on ASPEN/SCCM guidelines  MONITOR:   TF tolerance, Vent status, Labs, Weight trends, I & O's  REASON FOR ASSESSMENT:   Ventilator, Consult Enteral/tube feeding initiation and management  ASSESSMENT:    40 yo male admitted with intractable N/V for 2-3 days with jaundice, alcoholic hepatitis and severe anemia, pt with hx of EtOH abuse. Pt developed acute respiratory failure with ARDS requiring intubation on 7/19  Adult Tube Feeding Protocol ordered by MD this AM, TF has not started yet per Charli RN. OG located in stomach  Patient is currently intubated on ventilator support, fentanyl and versed for sedation, on levophed MV: 18.2 L/min Temp (24hrs), Avg:99.3 F (37.4 C), Min:98.6 F (37 C), Max:100.6 F (38.1 C)   Past Medical History  Diagnosis Date  . Depression   . OCD (obsessive compulsive disorder)   . Headache(784.0)   . LBP (low back pain)   . Alcohol abuse    Diet Order:   NPO  Skin:  Reviewed, no issues  Last BM:  7/18   Labs: reviewed  Meds: MVI, D5-LR with KCl at 75 ml/hr (306 kcals) Height:   Ht Readings from Last 1 Encounters:  02/02/16 6\' 3"  (1.905 m)    Weight: no major recent wt trends but weight definitely down since 2014  Wt Readings from Last 1 Encounters:  Nov 21, 2015 183 lb 4.8 oz (83.144 kg)   Wt Readings from Last 10 Encounters:  Nov 21, 2015  183 lb 4.8 oz (83.144 kg)  01/18/16 190 lb (86.183 kg)  11/19/12 216 lb 4 oz (98.09 kg)  12/04/11 222 lb 4 oz (100.812 kg)  08/22/11 233 lb 8 oz (105.915 kg)  03/02/11 221 lb 12 oz (100.585 kg)  10/24/10 222 lb 12.8 oz (101.061 kg)  06/27/10 218 lb 12 oz (99.224 kg)  06/08/10 214 lb 4 oz (97.183 kg)    BMI:  Body mass index is 22.91 kg/(m^2).  Estimated Nutritional Needs:   Kcal:  2454 kcals   Protein:  125-166 g  Fluid:  >/= 2.5 L  EDUCATION NEEDS:   No education needs identified at this time  Romelle StarcherCate Davonn Flanery MS, RD, LDN [phone] Pager  [phone] Weekend/On-Call Pager

## 2016-02-03 NOTE — Progress Notes (Signed)
PULMONARY / CRITICAL CARE MEDICINE   Name: Cameron Torres MRN: [id number] DOB: 1976-03-21    ADMISSION DATE:  02/09/2016 CONSULTATION DATE:  02/02/16  REFERRING MD: Sheryle Hail  MAJOR EVENTS/TEST RESULTS: 07/16 Admitted to med-surg floor, hospitalist service with history of alcohol abuse, intractable N/V X 2-3 days, jaundice, alcoholic hepatitis, severe anemia (Hgb 6.5), electrolyte derangements 07/16 RUQ [place]: Indeterminate 3.9 cm masslike area located just inferior to the sternum with internal vascularity. While this may potentially represent a mass emanating from the liver, additional etiologies are not excluded and this is indeterminate on ultrasound. This needs dedicated evaluation with contrast-enhanced CT. Hepatic steatosis. Mild nodularity of the liver raising the possibility of cirrhosis 07/16 CTAP: Morphologic changes to the liver suggestive of cirrhosis. Additionally there are findings compatible with portal venous hypertension including marked splenomegaly and re- cannulization of the periumbilical vein. Small amount of ascites within the paracolic gutters and central upper abdomen 07/16 CT chest: Tree-in-bud ground-glass opacities within the superior segment left lower lobe concerning for atypical infectious process. Multiple additional larger ground-glass opacities throughout the lungs bilaterally measuring up to 2.1 cm. 07/16 Transfused one unit PRBCs for Hgb 6.5 07/17 Transfused two units PRBCs for Hgb 5.8 07/18 Transfused one unit PRBCs for Hgb 6.9 07/19 Progressive hypoxemia. Transferred to ICU and intubated. CXR c/w severe ARDS. Required heavy sedation and intermittent NMBs for vent synchrony. Low BP > norepinephrine initiated  INDWELLING DEVICES:: ETT 07/19 >>  L [place] CVL 07/19 >>   MICRO DATA: Blood x2 07/16 >>negative 7/19>> Resp 07/19 >>   PROCALITONIN: 07/20: 11.30 07/21:  07/22:   ANTIMICROBIALS:  Levofloxacin 07/16 >> 07/18 Pip-tazo 07/18 >> 07/19 Meropenem  07/19 >>    SUBJECTIVE:  Pt sedated on versed and fentanyl intubated. RASS -2. + F/C on WUA  VITAL SIGNS: BP 114/49 mmHg  Pulse 92  Temp(Src) 99.4 F (37.4 C) (Oral)  Resp 35  Ht  (1.905 m)  Wt 183 lb 4.8 oz (83.144 kg)  BMI 28.70 kg/m2  SpO2 95%  HEMODYNAMICS: CVP:  [12 mmHg] 12 mmHg  VENTILATOR SETTINGS: Vent Mode:  [-] PRVC FiO2 (%):  [60 %-100 %] 60 % Set Rate:  [16 bmp-35 bmp] 35 bmp Vt Set:  [400 mL-510 mL] 510 mL PEEP:  [5 cmH20-14 cmH20] 10 cmH20  INTAKE / OUTPUT: I/O last 3 completed shifts: In: 10768.3 [I.V.:10293.3; IV Piggyback:475] Out: 2075 [Urine:2075]  PHYSICAL EXAMINATION: General: acutely ill appearing male, sedated, intubated, RASS -2 Neuro: follows commands, bilateral pupils 3 mm sluggish HEENT: Scleral icterus, supple Cardiovascular: s1s2, rrr, no M/R/G Lungs: rhonchi throughout, even, non labored Abdomen: obese, soft, hypoactive BS x4, non tender Ext: warm, no edema Skin: severe jaundice, innumerable telengectasias  LABS:  BMET  Recent Labs Lab 01/22/2016 0501 02/02/16 0320 02/03/16 0414  NA 129* 132* 135  K 3.2* 3.5 3.6  CL 96* 100* 102  CO2 BUN 12 12 23*  CREATININE 0.76 0.64 0.80  GLUCOSE 94 111* 152*    Electrolytes  Recent Labs Lab 01/31/16 2156 01/23/2016 0501 02/02/16 0320 02/03/16 0414  CALCIUM  --  8.2* 8.4* 7.9*  MG 2.1 1.8 1.5*  --   PHOS  --  UNABLE TO REPORT DUE TO ICTERUS 1.9*  --     CBC  Recent Labs Lab 02/03/2016 0501 02/14/2016 1651 02/02/16 0320 02/03/16 0414  WBC 11.2*  --  16.1* 12.2*  HGB 6.9* 7.9* 8.9* 8.3*  HCT 19.5* 22.6* 25.3* 23.8*  PLT 66*  --  86* 102*    Coag's  Recent Labs Lab 02/07/2016 1437  INR 1.92    Sepsis Markers No results for input(s): LATICACIDVEN, PROCALCITON, O2SATVEN in the last 168 hours.  ABG  Recent Labs Lab 02/02/16 0451 02/02/16 1024  PHART 7.34* 7.16*  PCO2ART 51* 81*  PO2ART 51* 146*    Liver Enzymes  Recent Labs Lab  06-09-2016 0501 02/02/16 0320 02/03/16 0414  AST 93* 85* 72*  ALT 32 36 35  ALKPHOS 55 67 45  BILITOT 12.5* 10.9* 10.3*  ALBUMIN 2.7* 2.7* 2.2*    Cardiac Enzymes No results for input(s): TROPONINI, PROBNP in the last 168 hours.  Glucose  Recent Labs Lab 02/02/16 0429  GLUCAP 128*    CXR: very severe ARDS pattern    ASSESSMENT / PLAN:  PULMONARY A: Acute hypoxic respiratory failure Suspected aspiration pneumonitis Severe ARDS - improved gas exchange and CXR 07/20 Ventilator dyssynchrony - improved in PCV mode P:   Vent settings changed to PC mode for vent synchrony-wean vent as tolerated  Vent bundle  ABG today-7/20 Daily SBT as indicated Empiric nebulized bronchodilators PRN vecuronium  CARDIOVASCULAR A:  Hypotension - decreasing vasopressor requirements P:  CVP's q 4hrs CVP goal 12-15 Levophed and Vasopressin gtt prn to maintain MAP goal > 65 mmHg-wean pressors as tolerated PRN NS boluses to maintain CVP goal  RENAL A:   Mild hyponatremia-resolving Mild hypokalemia-resolving P:   Monitor BMET intermittently Monitor I/Os Correct electrolytes as indicated Continue D5LR with 20 meq K+ at 75 ml/hr  GASTROINTESTINAL A:   Alcoholic cirrhosis - by exam findings, appears to be severe Acute alcoholic hepatitis Severe elevation of LFTs Intractable N/V - resolved P:   SUP: enteral famotidine Start tube feedings 7/20  HEMATOLOGIC A:   Severe anemia - likely acute blood loss. Concern for UGIB Thrombocytopenia Coagulopathy due to liver failure Elevated MCV P:  Administer Vitamin B-12 once 7/20 Begin IV folate 07/20 Check coags in the am 7/21 DVT px: SCDs Monitor CBC intermittently Transfuse per usual guidelines (for Hgb < 7.0) Monitor for s/sx of bleeding  INFECTIOUS A:   Severe sepsis Aspiration PNA High fever-resolving P:   Monitor temp, WBC count, Trend PCT's Micro and abx as above No acetaminophen due to severe liver  failure  ENDOCRINE A:   No issues noted P:   Maintain glu 70-180 Monitor serum glucose  NEUROLOGIC A:   Acute encephalopathy Alcohol withdrawal syndrome P:   RASS goal:  -2, -3 Versed and Fentanyl gtt to maintain RASS goal Start Folate and Thiamine PRN fentanyl WUA daily   FAMILY: Pts mother updated over phone by Dr Sung AmabileSimonds about plan of care and questions answered 7/20  Sonda Rumbleana Blakeney, AGNP  Pulmonary/Critical Care Pager [phone] (please enter 7 digits) PCCM Consult Pager [phone] (please enter 7 digits)  I have evaluated the patient and reviewed the database in its entirety. The care plan outlined above reflects the plan devised under my guidance and supervision. I have made modifications to the note above  CCM time: 35 mins The above time includes time spent in consultation with patient and/or family members and reviewing care plan on multidisciplinary rounds  Billy Fischeravid Estrella Alcaraz, MD PCCM service Mobile [phone] Pager [phone]

## 2016-02-03 NOTE — Care Management Note (Addendum)
Case Management Note  Patient Details  Name: Cameron Torres MRN: [id number] Date of Birth: 23-Feb-1976  Subjective/Objective:                   This RNCM received callback from patient's mother Ms. Calvert-- [phone] [place]. If patient needs placement she would prefer the [facility] [place]. She states patient was living alone independently but if he needs to live with her he may. She states he has crutches but no other DME. He "lost his job on Thursday due to ongoing illness". He has not had health insurance for quite some time and she has helped with urgent medications in the past. She does not think that he was on any medications prior to this admission. He has not seen a physician in over 2 years because of lack of health insurance.   Action/Plan:  RNCM will follow progression.   Expected Discharge Date:                  Expected Discharge Plan:     In-House Referral:     Discharge planning Services     Post Acute Care Choice:    Choice offered to:  Parent  DME Arranged:    DME Agency:     HH Arranged:    HH Agency:     Status of Service:  In process, will continue to follow  If discussed at Long Length of Stay Meetings, dates discussed:    Additional Comments:  Collie Siadngela Davionna Blacksher, RN 02/03/2016, 9:02 AM

## 2016-02-03 NOTE — Progress Notes (Signed)
Pharmacy Antibiotic Note  Cameron Torres is a 40 y.o. male admitted on 01/17/2016 with aspiration pneumonia and colitis.  Pharmacy has been consulted for Zosyn dosing. Abx changed to meropenem 7/19 to cover ESBL organisms per MD  Plan: Continue meropenem 1 g iv q 8 hours.   Height: 6\' 3"  (190.5 cm) (per mother) Weight: 183 lb 4.8 oz (83.144 kg) IBW/kg (Calculated) : 84.5  Temp (24hrs), Avg:99.3 F (37.4 C), Min:98.6 F (37 C), Max:100.6 F (38.1 C)   Recent Labs Lab 01/27/2016 1437 02/10/2016 1518 01/31/16 0605 02/14/2016 0501 02/02/16 0320 02/03/16 0414  WBC  --  12.4* 9.1 11.2* 16.1* 12.2*  CREATININE 0.87  --  0.59* 0.76 0.64 0.80    Estimated Creatinine Clearance: 141.4 mL/min (by C-G formula based on Cr of 0.8).    No Known Allergies  Antimicrobials this admission: 7/16 levofloxacin >> 7/18 7/18 Zosyn >> 7/18 Meropenem 7/19 >>  Microbiology results: 7/16 BCx: NG x 2 7/19 MRSA PCR: negative 7/19 Sputum: NGTD   Thank you for allowing pharmacy to be a part of this patient's care.  Cameron Torres, Cameron Torres 02/03/2016 10:43 AM  02/02/2016 Zosyn changed to meropenem for clinical worsening.

## 2016-02-04 ENCOUNTER — Inpatient Hospital Stay: Payer: Medicaid Other

## 2016-02-04 DIAGNOSIS — T8059XD Anaphylactic reaction due to other serum, subsequent encounter: Secondary | ICD-10-CM

## 2016-02-04 DIAGNOSIS — K7030 Alcoholic cirrhosis of liver without ascites: Secondary | ICD-10-CM

## 2016-02-04 LAB — GLUCOSE, CAPILLARY
GLUCOSE-CAPILLARY: 123 mg/dL — AB (ref 65–99)
GLUCOSE-CAPILLARY: 128 mg/dL — AB (ref 65–99)
GLUCOSE-CAPILLARY: 132 mg/dL — AB (ref 65–99)
GLUCOSE-CAPILLARY: 139 mg/dL — AB (ref 65–99)
Glucose-Capillary: 132 mg/dL — ABNORMAL HIGH (ref 65–99)
Glucose-Capillary: 139 mg/dL — ABNORMAL HIGH (ref 65–99)
Glucose-Capillary: 136 mg/dL — ABNORMAL HIGH (ref 65–99)

## 2016-02-04 LAB — COMPREHENSIVE METABOLIC PANEL
ALBUMIN: 2.1 g/dL — AB (ref 3.5–5.0)
ALT: 37 U/L (ref 17–63)
ANION GAP: 5 (ref 5–15)
AST: 75 U/L — ABNORMAL HIGH (ref 15–41)
Alkaline Phosphatase: 57 U/L (ref 38–126)
BILIRUBIN TOTAL: 7.2 mg/dL — AB (ref 0.3–1.2)
BUN: 35 mg/dL — ABNORMAL HIGH (ref 6–20)
CO2: 28 mmol/L (ref 22–32)
Calcium: 8 mg/dL — ABNORMAL LOW (ref 8.9–10.3)
Chloride: 106 mmol/L (ref 101–111)
Creatinine, Ser: 0.68 mg/dL (ref 0.61–1.24)
GFR calc Af Amer: >60 mL/min (ref >60)
GFR calc non Af Amer: >60 mL/min (ref >60)
GLUCOSE: 156 mg/dL — AB (ref 65–99)
POTASSIUM: 4.1 mmol/L (ref 3.5–5.1)
Sodium: 139 mmol/L (ref 135–145)
TOTAL PROTEIN: 6.4 g/dL — AB (ref 6.5–8.1)

## 2016-02-04 LAB — CBC WITH DIFFERENTIAL/PLATELET
Basophils Absolute: 0.1 10*3/uL (ref 0–0.1)
Basophils Absolute: 0.1 10*3/uL (ref 0–0.1)
EOS ABS: 0.2 10*3/uL (ref 0–0.7)
EOS ABS: 0.2 10*3/uL (ref 0–0.7)
HCT: 21.5 % — ABNORMAL LOW (ref 40.0–52.0)
HEMATOCRIT: 19.6 % — AB (ref 40.0–52.0)
Hemoglobin: 6.8 g/dL — ABNORMAL LOW (ref 13.0–18.0)
Hemoglobin: 7.3 g/dL — ABNORMAL LOW (ref 13.0–18.0)
Lymphocytes Relative: 13 %
Lymphs Abs: 1.7 10*3/uL (ref 1.0–3.6)
Lymphs Abs: 1.7 10*3/uL (ref 1.0–3.6)
MCH: 37.6 pg — ABNORMAL HIGH (ref 26.0–34.0)
MCH: 36.3 pg — AB (ref 26.0–34.0)
MCHC: 34.6 g/dL (ref 32.0–36.0)
MCHC: 34 g/dL (ref 32.0–36.0)
MCV: 108.6 fL — ABNORMAL HIGH (ref 80.0–100.0)
MCV: 106.6 fL — ABNORMAL HIGH (ref 80.0–100.0)
MONO ABS: 1.2 10*3/uL — AB (ref 0.2–1.0)
MONO ABS: 1.1 10*3/uL — AB (ref 0.2–1.0)
NEUTROS ABS: 9.6 10*3/uL — AB (ref 1.4–6.5)
NEUTROS ABS: 10.5 10*3/uL — AB (ref 1.4–6.5)
Neutrophils Relative %: 74 %
PLATELETS: 91 10*3/uL — AB (ref 150–440)
Platelets: 91 10*3/uL — ABNORMAL LOW (ref 150–440)
RBC: 1.8 MIL/uL — ABNORMAL LOW (ref 4.40–5.90)
RBC: 2.02 MIL/uL — AB (ref 4.40–5.90)
RDW: 25.5 % — AB (ref 11.5–14.5)
RDW: 25.7 % — AB (ref 11.5–14.5)
WBC: 12.8 10*3/uL — ABNORMAL HIGH (ref 3.8–10.6)
WBC: 13.5 10*3/uL — ABNORMAL HIGH (ref 3.8–10.6)

## 2016-02-04 LAB — CULTURE, BLOOD (ROUTINE X 2)
CULTURE: NO GROWTH
CULTURE: NO GROWTH

## 2016-02-04 LAB — PHOSPHORUS: Phosphorus: 2.4 mg/dL — ABNORMAL LOW (ref 2.5–4.6)

## 2016-02-04 LAB — MAGNESIUM: Magnesium: 1.9 mg/dL (ref 1.7–2.4)

## 2016-02-04 LAB — PROCALCITONIN: PROCALCITONIN: 7.65 ng/mL

## 2016-02-04 LAB — PROTIME-INR
INR: 1.96
Prothrombin Time: 22.2 seconds — ABNORMAL HIGH (ref 11.4–15.0)

## 2016-02-04 LAB — APTT: APTT: 37 s — AB (ref 24–36)

## 2016-02-04 LAB — PREPARE RBC (CROSSMATCH)

## 2016-02-04 MED ORDER — SODIUM CHLORIDE 0.9 % IV SOLN
Freq: Once | INTRAVENOUS | Status: AC
  Administered 2016-02-04: 13:00:00 via INTRAVENOUS

## 2016-02-04 MED ORDER — ANTISEPTIC ORAL RINSE SOLUTION (CORINZ)
7.0000 mL | OROMUCOSAL | Status: DC
  Administered 2016-02-04 – 2016-02-11 (×66): 7 mL via OROMUCOSAL
  Filled 2016-02-04 (×68): qty 7

## 2016-02-04 MED ORDER — DIAZEPAM 5 MG PO TABS
5.0000 mg | ORAL_TABLET | Freq: Four times a day (QID) | ORAL | Status: DC
  Administered 2016-02-05 (×2): 5 mg
  Filled 2016-02-04 (×2): qty 1

## 2016-02-04 MED ORDER — FREE WATER
100.0000 mL | Freq: Three times a day (TID) | Status: DC
  Administered 2016-02-04 – 2016-02-05 (×4): 100 mL

## 2016-02-04 MED ORDER — VITAL AF 1.2 CAL PO LIQD
1000.0000 mL | ORAL | Status: DC
Start: 2016-02-04 — End: 2016-02-11
  Administered 2016-02-05 – 2016-02-10 (×7): 1000 mL

## 2016-02-04 MED ORDER — THIAMINE HCL 100 MG/ML IJ SOLN
100.0000 mg | Freq: Every day | INTRAMUSCULAR | Status: DC
  Administered 2016-02-04 – 2016-02-08 (×5): 100 mg via INTRAVENOUS
  Filled 2016-02-04 (×5): qty 2

## 2016-02-04 NOTE — Plan of Care (Signed)
Problem: Pain Managment: Goal: General experience of comfort will improve Outcome: Progressing CPOT pain score is stable at a "1"

## 2016-02-04 NOTE — Progress Notes (Signed)
Nutrition Follow-up  DOCUMENTATION CODES:   Not applicable  INTERVENTION:  -TF: off D5 infusion, recommend increasing TF to rate of 70 ml/hr, continue Prostat BID; provides 2216 kcals, 156 g of protein and 1360 mL of free water. Continue PEPuP.  -If pt continues without BM, recommend further intervention regarding bowel regimen  NUTRITION DIAGNOSIS:   Inadequate oral intake related to acute illness as evidenced by NPO status.  Being addressed via TF  GOAL:   Provide needs based on ASPEN/SCCM guidelines  MONITOR:   TF tolerance, Vent status, Labs, Weight trends, I & O's  REASON FOR ASSESSMENT:   Ventilator, Consult Enteral/tube feeding initiation and management  ASSESSMENT:     Tolerating Vital AF 1.2 at rate of 65 ml/hr, Prostat BID, on PEPuP protocol.   Patient is currently intubated on ventilator support. Remains on fentanyl/versed, on levophed and vasopressin MV: 14.5 L/min Temp (24hrs), Avg:99.8 F (37.7 C), Min:98.8 F (37.1 C), Max:101.2 F (38.4 C)  Diet Order:   NPO  Skin:  Reviewed, no issues  Last BM:  7/18   Digestive System:   Labs: reviewed  Meds: MVI, thiamine, levophed, vasopressin, folic acid  Height:   Ht Readings from Last 1 Encounters:  02/02/16 6\' 3"  (1.905 m)    Weight:   Wt Readings from Last 1 Encounters:  02/04/16 182 lb 8.7 oz (82.8 kg)    Ideal Body Weight:     BMI:  Body mass index is 22.82 kg/(m^2).  Estimated Nutritional Needs:   Kcal:  2386 kcals  Protein:  125-166 g  Fluid:  >/= 2.5 L  EDUCATION NEEDS:   No education needs identified at this time  Romelle StarcherCate Audry Pecina MS, RD, LDN [phone] Pager  [phone] Weekend/On-Call Pager

## 2016-02-04 NOTE — Progress Notes (Signed)
PULMONARY / CRITICAL CARE MEDICINE   Name: Marcene Brawnnthony W Durrett MRN: [id number] DOB: Feb 05, 1976    ADMISSION DATE:  01/20/2016 CONSULTATION DATE:  02/02/16  REFERRING MD: Dr. Sheryle Hailiamond  MAJOR EVENTS/TEST RESULTS: 07/16 Admitted to med-surg floor, hospitalist service with history of alcohol abuse, intractable N/V X 2-3 days, jaundice, alcoholic hepatitis, severe anemia (Hgb 6.5), electrolyte derangements 07/16 RUQ [place]: Indeterminate 3.9 cm masslike area located just inferior to the sternum with internal vascularity. While this may potentially represent a mass emanating from the liver, additional etiologies are not excluded and this is indeterminate on ultrasound. This needs dedicated evaluation with contrast-enhanced CT. Hepatic steatosis. Mild nodularity of the liver raising the possibility of cirrhosis 07/16 CTAP: Morphologic changes to the liver suggestive of cirrhosis. Additionally there are findings compatible with portal venous hypertension including marked splenomegaly and re- cannulization of the periumbilical vein. Small amount of ascites within the paracolic gutters and central upper abdomen 07/16 CT chest: Tree-in-bud ground-glass opacities within the superior segment left lower lobe concerning for atypical infectious process. Multiple additional larger ground-glass opacities throughout the lungs bilaterally measuring up to 2.1 cm. 07/16 Transfused one unit PRBCs for Hgb 6.5 07/17 Transfused two units PRBCs for Hgb 5.8 07/18 Transfused one unit PRBCs for Hgb 6.9 07/19 Progressive hypoxemia. Transferred to ICU and intubated. CXR c/w severe ARDS. Required heavy sedation and intermittent NMBs for vent synchrony. Low BP > norepinephrine initiated 07/20 improved gas exchange and CXR 07/21 weaning vasopressors. Transfused one unit PRBCs for Hgb 6.8  INDWELLING DEVICES:: ETT 07/19 >>  L [place] CVL 07/19 >>   MICRO DATA: Blood 07/16 >>  Resp 07/19 >>    PROCALITONIN: 07/20: 11.30 07/21:  7.65 07/22:   ANTIMICROBIALS:  Levofloxacin 07/16 >> 07/18 Pip-tazo 07/18 >> 07/19 Meropenem 07/19 >>    SUBJECTIVE:  No acute issues overnight. Awakens to voice and touch but not following commands.   VITAL SIGNS: BP 121/54 mmHg  Pulse 102  Temp(Src) 99.8 F (37.7 C) (Oral)  Resp 22  Ht 6\' 3"  (1.905 m)  Wt 182 lb 8.7 oz (82.8 kg)  BMI 22.82 kg/m2  SpO2 94%  HEMODYNAMICS: CVP:  [13 mmHg-14 mmHg] 14 mmHg  VENTILATOR SETTINGS: Vent Mode:  [-] PCV FiO2 (%):  [50 %] 50 % Set Rate:  [18 bmp-35 bmp] 18 bmp Vt Set:  [510 mL] 510 mL PEEP:  [10 cmH20] 10 cmH20  INTAKE / OUTPUT: I/O last 3 completed shifts: In: 12161.2 [I.V.:11169.4; NG/GT:516.8; IV Piggyback:475] Out: 2425 [Urine:2425]  PHYSICAL EXAMINATION: General: acutely ill-looking male, sedated, intubated, RASS -2 Neuro: Awakens to voice and noxious stimulus, follows some basic commands, +gag, +corneals and PERRLA HEENT: Scleral icterus, neck is supple, no JVD Cardiovascular: Reg, no M Lungs: diffuse rhonchi Abdomen: obese, soft, hypoactive BS x4, non tender Ext: warm, no edema, +2 pulses Skin: severe jaundice, innumerable telengectasias  LABS:  BMET  Recent Labs Lab 01/31/2016 0501 02/02/16 0320 02/03/16 0414  NA 129* 132* 135  K 3.2* 3.5 3.6  CL 96* 100* 102  CO2 24 24 27   BUN 12 12 23*  CREATININE 0.76 0.64 0.80  GLUCOSE 94 111* 152*    Electrolytes  Recent Labs Lab 01/29/2016 0501 02/02/16 0320 02/03/16 0414  CALCIUM 8.2* 8.4* 7.9*  MG 1.8 1.5* 1.8  PHOS UNABLE TO REPORT DUE TO ICTERUS 1.9*  --     CBC  Recent Labs Lab 02/14/2016 0501 01/23/2016 1651 02/02/16 0320 02/03/16 0414  WBC 11.2*  --  16.1* 12.2*  HGB 6.9*  7.9* 8.9* 8.3*  HCT 19.5* 22.6* 25.3* 23.8*  PLT 66*  --  86* 102*    Coag's  Recent Labs Lab 01/29/2016 1437  INR 1.92    Sepsis Markers  Recent Labs Lab 02/03/16 0414  PROCALCITON 11.30    ABG  Recent Labs Lab 02/02/16 0451 02/02/16 1024  02/03/16 0740  PHART 7.34* 7.16* 7.35  PCO2ART 51* 81* 51*  PO2ART 51* 146* 82*    Liver Enzymes  Recent Labs Lab 2016-02-20 0501 02/02/16 0320 02/03/16 0414  AST 93* 85* 72*  ALT 32 36 35  ALKPHOS 55 67 45  BILITOT 12.5* 10.9* 10.3*  ALBUMIN 2.7* 2.7* 2.2*    Cardiac Enzymes No results for input(s): TROPONINI, PROBNP in the last 168 hours.  Glucose  Recent Labs Lab 02/02/16 0429 02/03/16 1425 02/03/16 1840 02/03/16 2001 02/03/16 2351 02/04/16 0410  GLUCAP 128* 145* 128* 140* 140* 132*    CXR: +/- improved ARDS pattern   ASSESSMENT / PLAN:  PULMONARY A: Acute hypoxic respiratory failure-Vent dyssynchrony improved with PC vent mode Suspected aspiration pneumonitis Severe ARDS - improved gas exchange and CXR 07/20 Ventilator dyssynchrony - improved in PCV mode P:   Cont full vent support - settings reviewed and/or adjusted Cont vent bundle Daily SBT if/when meets criteria  CARDIOVASCULAR A:  Hypotension - decreasing vasopressor requirements P:  MAP goal 65 mmHg DC vasopressin Wean NE as able  RENAL A:   Mild hyponatremia-resolved Mild hypokalemia-resolved P:   Monitor BMET intermittently Monitor I/Os Correct electrolytes as indicated  GASTROINTESTINAL A:   Severe alcoholic cirrhosis Acute alcoholic hepatitis Severe elevation of LFTs - improving Intractable N/V - resolved P:   SUP: enteral famotidine Tube feeds started 7/20 Titrate feeds as tolerated to goal  HEMATOLOGIC A:   Severe anemia - likely acute blood loss; concern for UGIB Thrombocytopenia Coagulopathy due to liver failure Elevated MCV P:  DVT px: SCDs Monitor CBC intermittently Transfuse per usual guidelines Cont folate Received Vitamin B12 07/20  INFECTIOUS A:   Severe sepsis Aspiration PNA Fever Elevated PCT - improving P:   Monitor temp, WBC count Micro and abx as above No acetaminophen due to severe liver failure Cooling blanket prn for fever PCT  algorithm  ENDOCRINE A:   No issues noted P:   Maintain glu 70-180 Monitor serum glucose  NEUROLOGIC A:   Acute metabolic encephalopathy 2/2 cirrhosis Alcohol withdrawal syndrome P:   RASS goal:  -1, -2 DC fentanyl gtt > intermittent fentanyl Continue midaz infusion  Continue Thiamine and Multivitamin WUA daily   FAMILY:     CCM time: 35 mins The above time includes time spent in consultation with patient and/or family members and reviewing care plan on multidisciplinary rounds  Billy Fischer, MD PCCM service Mobile [phone] Pager [phone]

## 2016-02-04 NOTE — Progress Notes (Signed)
Alerted Ms. Luci Bankukov, NP that due to pt.'s agitation/restlessness and tachypnea this RN has given fentanyl PRN x 2, versed PRN x1 and increased versed throughout shift (see EMR for details) NP ordered scheduled valium. Will continue to monitor pt. Closely.

## 2016-02-04 NOTE — Progress Notes (Signed)
Levophed running at 8 mcg/min at shift change this morning. Rate decreased to 6 mcg/min.

## 2016-02-04 NOTE — Progress Notes (Signed)
Pharmacy Antibiotic Note  Cameron Torres is a 40 y.o. male admitted on 09/29/15 with aspiration pneumonia and colitis.  Pharmacy has been consulted for Zosyn dosing. Abx changed to meropenem 7/19 to cover ESBL organisms per MD  Plan: Continue meropenem 1 g iv q 8 hours.   Height: 6\' 3"  (190.5 cm) (per mother) Weight: 182 lb 8.7 oz (82.8 kg) IBW/kg (Calculated) : 84.5  Temp (24hrs), Avg:99.9 F (37.7 C), Min:99.4 F (37.4 C), Max:101.2 F (38.4 C)   Recent Labs Lab 01/31/16 0605 02/02/2016 0501 02/02/16 0320 02/03/16 0414 02/04/16 0625  WBC 9.1 11.2* 16.1* 12.2* 12.8*  CREATININE 0.59* 0.76 0.64 0.80 0.68    Estimated Creatinine Clearance: 140.9 mL/min (by C-G formula based on Cr of 0.68).    No Known Allergies  Antimicrobials this admission: 7/16 levofloxacin >> 7/18 7/18 Zosyn >> 7/18 Meropenem 7/19 >>  Microbiology results: 7/16 BCx: NG x 2 7/19 MRSA PCR: negative 7/19 Sputum: NGTD   Thank you for allowing pharmacy to be a part of this patient's care.  Luisa HartChristy, Heath Tesler D 02/04/2016 10:48 AM

## 2016-02-04 NOTE — Progress Notes (Signed)
Sedation decreased by 50% to perform WUA. Fentanyl decreased to 50 mcg/hr and versed decreased to 1.5 mg/hr Pt initially synchronous with ventilator; however, became asynchronous and sats dropped to 88% 30 min later.  Fentanyl increased to 75 mcg/hr and a 25 mcg bolus given. Versed remained at 1.5 mg/hr. After approximately 10 minutes, pt became very restless and continued to be asynchronous with ventilator. Sedation increased back to 100% with fentanyl at 100 mcg/hr and versed at 3 mg/hr.

## 2016-02-04 NOTE — Progress Notes (Signed)
This RN notified RT again regarding pt further respiratory decline. Pt sats mid 80s on 5L [place]. Lung sounds rhonchi/crackles (somewhat wet sounding). I was instructed by RT to place pt on non-re breather. Sats came up to 98-100%; however, mentation was starting to decline at this point. The pt was still able to answer questions appropriately, but his attention and concentration were in question and he was struggling to keep his eyes open. Dr. Sheryle Hailiamond was notified again regarding pt decline. This RN suggested a possible x-ray and need to diurese, but Dr. Sheryle Hailiamond said he wanted to see the pt. In the meantime RT arrived and administered a breathing tx that had little effect on his resp status. Dr. Sheryle Hailiamond ordered to transfer pt to CCU-SD for closer monitoring. Report given to [facility], Charity fundraiserN.

## 2016-02-05 ENCOUNTER — Inpatient Hospital Stay: Payer: Medicaid Other

## 2016-02-05 DIAGNOSIS — J690 Pneumonitis due to inhalation of food and vomit: Secondary | ICD-10-CM

## 2016-02-05 LAB — GLUCOSE, CAPILLARY
GLUCOSE-CAPILLARY: 137 mg/dL — AB (ref 65–99)
GLUCOSE-CAPILLARY: 126 mg/dL — AB (ref 65–99)
GLUCOSE-CAPILLARY: 139 mg/dL — AB (ref 65–99)
Glucose-Capillary: 129 mg/dL — ABNORMAL HIGH (ref 65–99)
Glucose-Capillary: 126 mg/dL — ABNORMAL HIGH (ref 65–99)
Glucose-Capillary: 152 mg/dL — ABNORMAL HIGH (ref 65–99)

## 2016-02-05 LAB — CBC
HCT: 20.2 % — ABNORMAL LOW (ref 40.0–52.0)
Hemoglobin: 6.9 g/dL — ABNORMAL LOW (ref 13.0–18.0)
MCH: 36.2 pg — AB (ref 26.0–34.0)
MCHC: 34.2 g/dL (ref 32.0–36.0)
MCV: 105.8 fL — ABNORMAL HIGH (ref 80.0–100.0)
PLATELETS: 95 10*3/uL — AB (ref 150–440)
RBC: 1.91 MIL/uL — ABNORMAL LOW (ref 4.40–5.90)
RDW: 25.3 % — AB (ref 11.5–14.5)
WBC: 11.8 10*3/uL — ABNORMAL HIGH (ref 3.8–10.6)

## 2016-02-05 LAB — BASIC METABOLIC PANEL
Anion gap: 5 (ref 5–15)
Anion gap: 7 (ref 5–15)
BUN: 41 mg/dL — AB (ref 6–20)
BUN: 55 mg/dL — AB (ref 6–20)
CALCIUM: 8.7 mg/dL — AB (ref 8.9–10.3)
CHLORIDE: 108 mmol/L (ref 101–111)
CHLORIDE: 108 mmol/L (ref 101–111)
CO2: 31 mmol/L (ref 22–32)
CO2: 28 mmol/L (ref 22–32)
CREATININE: 0.45 mg/dL — AB (ref 0.61–1.24)
CREATININE: 0.74 mg/dL (ref 0.61–1.24)
Calcium: 8.9 mg/dL (ref 8.9–10.3)
GFR calc Af Amer: >60 mL/min (ref >60)
GFR calc non Af Amer: >60 mL/min (ref >60)
Glucose, Bld: 132 mg/dL — ABNORMAL HIGH (ref 65–99)
Glucose, Bld: 168 mg/dL — ABNORMAL HIGH (ref 65–99)
Potassium: 4.3 mmol/L (ref 3.5–5.1)
Potassium: 4.3 mmol/L (ref 3.5–5.1)
SODIUM: 144 mmol/L (ref 135–145)
SODIUM: 143 mmol/L (ref 135–145)

## 2016-02-05 LAB — MAGNESIUM
Magnesium: 2 mg/dL (ref 1.7–2.4)
Magnesium: 1.9 mg/dL (ref 1.7–2.4)

## 2016-02-05 LAB — CULTURE, RESPIRATORY: CULTURE: NO GROWTH

## 2016-02-05 LAB — PREPARE RBC (CROSSMATCH)

## 2016-02-05 LAB — PHOSPHORUS: PHOSPHORUS: 2.1 mg/dL — AB (ref 2.5–4.6)

## 2016-02-05 LAB — CULTURE, RESPIRATORY W GRAM STAIN

## 2016-02-05 LAB — VITAMIN B12: Vitamin B-12: 2310 pg/mL — ABNORMAL HIGH (ref 180–914)

## 2016-02-05 LAB — PROCALCITONIN: PROCALCITONIN: 4.15 ng/mL

## 2016-02-05 LAB — AMMONIA: AMMONIA: 90 umol/L — AB (ref 9–35)

## 2016-02-05 MED ORDER — SODIUM CHLORIDE 0.9 % IV SOLN
Freq: Once | INTRAVENOUS | Status: AC
  Administered 2016-02-05: 11:00:00 via INTRAVENOUS

## 2016-02-05 MED ORDER — FENTANYL CITRATE (PF) 100 MCG/2ML IJ SOLN
50.0000 ug | Freq: Once | INTRAMUSCULAR | Status: AC
  Administered 2016-02-05: 50 ug via INTRAVENOUS

## 2016-02-05 MED ORDER — FENTANYL 2500MCG IN NS 250ML (10MCG/ML) PREMIX INFUSION
25.0000 ug/h | INTRAVENOUS | Status: DC
  Administered 2016-02-05: 50 ug/h via INTRAVENOUS
  Administered 2016-02-06 (×3): 400 ug/h via INTRAVENOUS
  Administered 2016-02-07: 125 ug/h via INTRAVENOUS
  Administered 2016-02-07: 400 ug/h via INTRAVENOUS
  Administered 2016-02-08: 150 ug/h via INTRAVENOUS
  Administered 2016-02-08: 275 ug/h via INTRAVENOUS
  Administered 2016-02-09: 30 ug/h via INTRAVENOUS
  Administered 2016-02-09: 400 ug/h via INTRAVENOUS
  Administered 2016-02-09: 300 ug/h via INTRAVENOUS
  Administered 2016-02-11: 400 ug/h via INTRAVENOUS
  Filled 2016-02-05 (×17): qty 250

## 2016-02-05 MED ORDER — MIDAZOLAM BOLUS VIA INFUSION
2.0000 mg | INTRAVENOUS | Status: DC | PRN
  Filled 2016-02-05: qty 4

## 2016-02-05 MED ORDER — LACTULOSE 10 GM/15ML PO SOLN
30.0000 g | Freq: Two times a day (BID) | ORAL | Status: DC
  Administered 2016-02-05 – 2016-02-07 (×6): 30 g
  Filled 2016-02-05 (×6): qty 60

## 2016-02-05 MED ORDER — SODIUM CHLORIDE 0.9 % IV SOLN
3.0000 g | Freq: Four times a day (QID) | INTRAVENOUS | Status: DC
  Administered 2016-02-05 – 2016-02-09 (×16): 3 g via INTRAVENOUS
  Filled 2016-02-05 (×18): qty 3

## 2016-02-05 MED ORDER — SODIUM CHLORIDE 0.9 % IV SOLN
0.0000 mg/h | INTRAVENOUS | Status: DC
  Filled 2016-02-05: qty 10

## 2016-02-05 MED ORDER — FENTANYL CITRATE (PF) 100 MCG/2ML IJ SOLN
25.0000 ug | INTRAMUSCULAR | Status: DC | PRN
  Administered 2016-02-05 (×2): 100 ug via INTRAVENOUS
  Filled 2016-02-05 (×2): qty 2

## 2016-02-05 MED ORDER — FOLIC ACID 1 MG PO TABS
1.0000 mg | ORAL_TABLET | Freq: Every day | ORAL | Status: DC
  Administered 2016-02-05 – 2016-02-10 (×6): 1 mg via ORAL
  Filled 2016-02-05 (×7): qty 1

## 2016-02-05 MED ORDER — FREE WATER
200.0000 mL | Freq: Three times a day (TID) | Status: DC
Start: 2016-02-05 — End: 2016-02-06
  Administered 2016-02-05 – 2016-02-06 (×3): 200 mL

## 2016-02-05 MED ORDER — RIFAXIMIN 200 MG PO TABS
200.0000 mg | ORAL_TABLET | Freq: Three times a day (TID) | ORAL | Status: DC
  Administered 2016-02-05 – 2016-02-11 (×19): 200 mg
  Filled 2016-02-05 (×20): qty 1

## 2016-02-05 MED ORDER — FENTANYL BOLUS VIA INFUSION
50.0000 ug | INTRAVENOUS | Status: DC | PRN
  Administered 2016-02-06 – 2016-02-11 (×13): 50 ug via INTRAVENOUS
  Filled 2016-02-05: qty 50

## 2016-02-05 MED ORDER — DEXMEDETOMIDINE HCL IN NACL 200 MCG/50ML IV SOLN: 0.4000 ug/kg/h | INTRAVENOUS | Status: DC

## 2016-02-05 MED ORDER — DEXMEDETOMIDINE HCL IN NACL 400 MCG/100ML IV SOLN
0.0000 ug/kg/h | INTRAVENOUS | Status: AC
  Administered 2016-02-05: 0.4 ug/kg/h via INTRAVENOUS
  Administered 2016-02-05 – 2016-02-06 (×5): 1.4 ug/kg/h via INTRAVENOUS
  Filled 2016-02-05 (×9): qty 100

## 2016-02-05 MED ORDER — FENTANYL CITRATE (PF) 100 MCG/2ML IJ SOLN
25.0000 ug | INTRAMUSCULAR | Status: DC | PRN
  Administered 2016-02-05 (×3): 100 ug via INTRAVENOUS
  Filled 2016-02-05 (×3): qty 2

## 2016-02-05 NOTE — Progress Notes (Signed)
Called pharmacy to change ordered times of Xifaxan. First dose given at 1129, scheduled every 8 hours, next time ordered to be given is 1400.

## 2016-02-05 NOTE — Progress Notes (Signed)
Called E-link r/t pt.'s tachypnea (RR: 29-30) with precedex gtt at max for last few hours and post fentanyl PRN (given due to agitation during bath). Discussed pt.'s vent settings yesterday and today, Dx lab results, MD looked over pt. Via camera.  MD increased frequency of fentanyl PRN if pt. Is agitated or has discomfort. Was instructed to call back if pt. Shows signs of air-trapping.   Will monitor pt. Closely.

## 2016-02-05 NOTE — Progress Notes (Signed)
Pt. Needing PRN fentanyl Q 2 h, versed still at max, after bolus pt. Breathing over ventilator rate in high 30's.  NP called bedside, pt. All over bed, kicking out pillows, etc. NP changed sedation gtt to precedex and ordered versed PRN.  Due to abdominal distension ordered ammonia level for this AM.  Will continue to monitor pt. Closely.

## 2016-02-05 NOTE — Progress Notes (Signed)
Cooling blanket removed per MD Simonds request.  Will continue to monitor patient.

## 2016-02-05 NOTE — Progress Notes (Signed)
eLink Physician-Brief Progress Note Patient Name: FAIZAAN MABRA DOB: [birth year] MRN: [id number]   Date of Service  02/05/2016  HPI/Events of Note  7 beat run of VT.  eICU Interventions  Will order: 1. BMP and Mg++ level now.     Intervention Category Major Interventions: Acute renal failure - evaluation and management;Arrhythmia - evaluation and management  Estelita Iten Eugene 02/05/2016, 10:43 PM

## 2016-02-05 NOTE — Progress Notes (Signed)
PULMONARY / CRITICAL CARE MEDICINE   Name: Cameron Torres MRN: [id number] DOB: 1976-05-03    ADMISSION DATE:  2016/02/09 CONSULTATION DATE:  02/02/16  REFERRING MD: Dr. Sheryle Hail  MAJOR EVENTS/TEST RESULTS: 07/16 Admitted to med-surg floor, hospitalist service with history of alcohol abuse, intractable N/V X 2-3 days, jaundice, alcoholic hepatitis, severe anemia (Hgb 6.5), electrolyte derangements 07/16 RUQ [place]: Indeterminate 3.9 cm masslike area located just inferior to the sternum with internal vascularity. While this may potentially represent a mass emanating from the liver, additional etiologies are not excluded and this is indeterminate on ultrasound. This needs dedicated evaluation with contrast-enhanced CT. Hepatic steatosis. Mild nodularity of the liver raising the possibility of cirrhosis 07/16 CTAP: Morphologic changes to the liver suggestive of cirrhosis. Additionally there are findings compatible with portal venous hypertension including marked splenomegaly and re- cannulization of the periumbilical vein. Small amount of ascites within the paracolic gutters and central upper abdomen 07/16 CT chest: Tree-in-bud ground-glass opacities within the superior segment left lower lobe concerning for atypical infectious process. Multiple additional larger ground-glass opacities throughout the lungs bilaterally measuring up to 2.1 cm. 07/16 Transfused one unit PRBCs for Hgb 6.5 07/17 Transfused two units PRBCs for Hgb 5.8 07/18 Transfused one unit PRBCs for Hgb 6.9 07/19 Progressive hypoxemia. Transferred to ICU and intubated. CXR c/w severe ARDS. Required heavy sedation and intermittent NMBs for vent synchrony. Low BP > norepinephrine initiated 07/20 improved gas exchange and CXR 07/21 weaning vasopressors. Transfused one unit PRBCs for Hgb 6.8 07/22 Transfused one unit PRBCs for Hgb 6.9. Decreased responsiveness on WUA. Ammonia 90. Lactulose and rifaximin initiated  INDWELLING  DEVICES:: ETT 07/19 >>  L [place] CVL 07/19 >>   MICRO DATA: Blood 07/16 >> NEG Resp 07/19 >> NEG   PROCALITONIN: 07/20: 11.30 07/21: 7.65 07/22: 4.15  ANTIMICROBIALS:  Levofloxacin 07/16 >> 07/18 Pip-tazo 07/18 >> 07/19 Meropenem 07/19 >> 07/22 Unasyn 07/22 >>    SUBJECTIVE:  Minimal responsiveness on WUA  VITAL SIGNS: BP 104/51 mmHg  Pulse 88  Temp(Src) 98.8 F (37.1 C) (Rectal)  Resp 23  Ht  (1.905 m)  Wt 193 lb 5.5 oz (87.7 kg)  BMI 24.17 kg/m2  SpO2 97%  HEMODYNAMICS: CVP:  [11 mmHg-23 mmHg] 14 mmHg  VENTILATOR SETTINGS: Vent Mode:  [-] PCV FiO2 (%):  [50 %] 50 % Set Rate:  [14 bmp-18 bmp] 14 bmp PEEP:  [10 cmH20] 10 cmH20 Plateau Pressure:  [27 cmH20] 27 cmH20  INTAKE / OUTPUT: I/O last 3 completed shifts: In: 4449.5 [I.V.:1498.5; Blood:342; Other:70; NG/GT:2139; IV Piggyback:400] Out: 2820 [Urine:2820]  PHYSICAL EXAMINATION: General: NAD, RASS -3 Neuro: PERRL, MAEs, DTRs symmetric and normal HEENT: Scleral icterus Cardiovascular: RRR s M Lungs: few rhonchi Abdomen: soft, NT, +BS Ext: warm, no edema, +2 pulses Skin: severe jaundice, innumerable telengectasias  LABS:  BMET  Recent Labs Lab 02/03/16 0414 02/04/16 0625 02/05/16 0416  NA 135 139 144  K 3.6 4.1 4.3  CL 102 106 108  CO2 BUN 23* 35* 41*  CREATININE 0.80 0.68 0.45*  GLUCOSE 152* 156* 132*    Electrolytes  Recent Labs Lab 02/02/16 0320 02/03/16 0414 02/04/16 0625 02/05/16 0416  CALCIUM 8.4* 7.9* 8.0* 8.7*  MG 1.5* 1.8 1.9 2.0  PHOS 1.9*  --  2.4* 2.1*    CBC  Recent Labs Lab 02/04/16 0625 02/04/16 1839 02/05/16 0416  WBC 12.8* 13.5* 11.8*  HGB 6.8* 7.3* 6.9*  HCT 19.6* 21.5* 20.2*  PLT 91* 91* 95*  Coag's  Recent Labs Lab 2016/02/27 1437 02/04/16 0625  APTT  --  37*  INR 1.92 1.96    Sepsis Markers  Recent Labs Lab 02/03/16 0414 02/04/16 0625 02/05/16 0416  PROCALCITON 11.30 7.65 4.15    ABG  Recent Labs Lab  02/02/16 1024 02/03/16 0740 02/05/16 0500  PHART 7.16* 7.35 7.42  PCO2ART 81* 51* 55*  PO2ART 146* 82* 98    Liver Enzymes  Recent Labs Lab 02/02/16 0320 02/03/16 0414 02/04/16 0625  AST 85* 72* 75*  ALT 36 35 37  ALKPHOS 67 45 57  BILITOT 10.9* 10.3* 7.2*  ALBUMIN 2.7* 2.2* 2.1*    Cardiac Enzymes No results for input(s): TROPONINI, PROBNP in the last 168 hours.  Glucose  Recent Labs Lab 02/04/16 1600 02/04/16 1942 02/04/16 2338 02/04/16 2340 02/05/16 0355 02/05/16 0704  GLUCAP 123* 139* 132* 128* 137* 129*    CXR: improving ARDS pattern   ASSESSMENT / PLAN:  PULMONARY A: Acute hypoxic respiratory failure Aspiration pneumonitis Severe ARDS - improving Ventilator dyssynchrony, resolved in PCV P:   Cont full vent support - settings reviewed and/or adjusted Cont vent bundle Daily SBT when meets criteria  CARDIOVASCULAR A:  Hypotension - decreasing vasopressor requirements P:  MAP goal 65 mmHg Wean NE as able  RENAL A:   Mild hyponatremia-resolved Mild hypokalemia-resolved P:   Monitor BMET intermittently Monitor I/Os Correct electrolytes as indicated Increase free water 07/22  GASTROINTESTINAL A:   Severe alcoholic cirrhosis Acute alcoholic hepatitis Severe elevation of LFTs - improving Intractable N/V - resolved P:   SUP: enteral famotidine TF protocol initiated 07/20 Follow LFTs intermittently  HEMATOLOGIC A:   Acute blood loss anemia Thrombocytopenia Coagulopathy due to liver failure Elevated MCV P:  DVT px: SCDs Monitor CBC intermittently Transfuse per usual guidelines Cont folate, thiamine, MOV, and Vitamin   INFECTIOUS A:   Severe sepsis Aspiration PNA Elevated PCT - improving P:   Monitor temp, WBC count Micro and abx as above No acetaminophen due to severe liver failure Cooling blanket prn for fever  ENDOCRINE A:   No issues noted P:   Maintain glu 70-180 Monitor serum glucose  NEUROLOGIC A:    Acute metabolic encephalopathy 2/2 cirrhosis Alcohol withdrawal syndrome Elevated ammonia level - suspect hepatic encephalopathy P:   RASS goal:  0, -1 Minimize sedation Dexmedetomidine initiated 07/21 Lactulose, rifaximin initiated 07/22 WUA daily   FAMILY:  Mother updated over phone   CCM time: 83 mins  Billy Fischer, MD PCCM service Mobile [phone] Pager [phone] 02/05/2016

## 2016-02-05 NOTE — Progress Notes (Signed)
Pharmacy Antibiotic Note  Cameron Torres is a 40 y.o. male admitted on 02/04/2016 with aspiration pneumonia.  Pharmacy has been consulted for ampicillin/sulbactam dosing.  Plan: Patient will be started on ampicillin/sulbactam 3g IV q6h. According to Dr. Sung Amabile notes, pt is to be deescalated from meropenem.   Height: 6\' 3"  (190.5 cm) (per mother) Weight: 193 lb 5.5 oz (87.7 kg) IBW/kg (Calculated) : 84.5  Temp (24hrs), Avg:98.9 F (37.2 C), Min:98.4 F (36.9 C), Max:99.4 F (37.4 C)   Recent Labs Lab 02/09/2016 0501 02/02/16 0320 02/03/16 0414 02/04/16 0625 02/04/16 1839 02/05/16 0416  WBC 11.2* 16.1* 12.2* 12.8* 13.5* 11.8*  CREATININE 0.76 0.64 0.80 0.68  --  0.45*    Estimated Creatinine Clearance: 143.8 mL/min (by C-G formula based on Cr of 0.45).    No Known Allergies  Antimicrobials this admission: Levofloxacin 07/17 >> 07/18 Pip-tazo 07/18 >> 07/19 Meropenem 07/19 >> 07/22 Rifaximin 07/22 >>  Unasyn 07/22 >>   Microbiology results: 07/16 BCx: No growth 5 days 07/19 Sputum: Pending  07/19 MRSA PCR: Negative  Thank you for allowing pharmacy to be a part of this patient's care.  Horris Latino, PharmD 02/05/2016 12:57 PM

## 2016-02-05 NOTE — Progress Notes (Signed)
LCSW consulted with ICU nurse and patient remains on vent as of yesterday and today. LCSW will follow over weekend and collect information from patients mother to complete assessment. Last note was he can either return to his mothers or she prefers he goes to the [facility] in [place].  No further needs at this time.  [employer] LCSW [phone]

## 2016-02-05 NOTE — Progress Notes (Signed)
Pt. Had 7 beats of V-tach, asymptomatic and other VSS. Alerted E-link, discussed Phos levels with Marisue Ivan, Charity fundraiser. Will continue to monitor pt. Closely.

## 2016-02-05 NOTE — Progress Notes (Signed)
eLink Physician-Brief Progress Note Patient Name: Cameron Torres DOB: Dec 21, 1975 MRN: [id number]   Date of Service  02/05/2016  HPI/Events of Note  Ongoing agitation despite precedex at 1.4 and hourly 100 mcg of fentanyl.  Breathing over the vent, moving in bed.  eICU Interventions  Plan: D/C current PRN fentanyl Cont fentanyl gtt with lower dose PRN fentanyl for breakthrough.     Intervention Category Major Interventions: Delirium, psychosis, severe agitation - evaluation and management  DETERDING,ELIZABETH 02/05/2016, 11:53 PM

## 2016-02-05 NOTE — Progress Notes (Signed)
Called and spoke to pharmacist and questioned Xifaxan. Per pharmacist ok to give second dose of medication now.

## 2016-02-05 NOTE — Progress Notes (Signed)
Called E-link and spoke with Marisue Ivan, RN r/t pt.'s agitation. Pt. On max dose of precedex and received only PRN 30 min. Ago. Pt. Trying to take off SCD's, and get OOB. Requested fentanyl gtt.

## 2016-02-05 NOTE — Progress Notes (Signed)
Cooling blanket reapplied, per order for rising temperature.  Will continue to monitor patient.

## 2016-02-05 NOTE — Progress Notes (Signed)
eLink Physician-Brief Progress Note Patient Name: Cameron Torres DOB: 10-Nov-1975 MRN: [id number]   Date of Service  02/05/2016  HPI/Events of Note  Agitation  eICU Interventions  Will increase Fentanyl IV PRN dose to Q 1 hour PRN.      Intervention Category Minor Interventions: Agitation / anxiety - evaluation and management  Briget Shaheed Eugene 02/05/2016, 8:15 PM

## 2016-02-06 ENCOUNTER — Inpatient Hospital Stay: Payer: Medicaid Other

## 2016-02-06 DIAGNOSIS — R410 Disorientation, unspecified: Secondary | ICD-10-CM

## 2016-02-06 DIAGNOSIS — R6520 Severe sepsis without septic shock: Secondary | ICD-10-CM

## 2016-02-06 DIAGNOSIS — A419 Sepsis, unspecified organism: Secondary | ICD-10-CM

## 2016-02-06 LAB — MAGNESIUM: MAGNESIUM: 2.2 mg/dL (ref 1.7–2.4)

## 2016-02-06 LAB — CBC
HEMATOCRIT: 22.6 % — AB (ref 40.0–52.0)
Hemoglobin: 7.8 g/dL — ABNORMAL LOW (ref 13.0–18.0)
MCH: 35.8 pg — ABNORMAL HIGH (ref 26.0–34.0)
MCHC: 34.3 g/dL (ref 32.0–36.0)
MCV: 104.4 fL — ABNORMAL HIGH (ref 80.0–100.0)
PLATELETS: 114 10*3/uL — AB (ref 150–440)
RBC: 2.16 MIL/uL — AB (ref 4.40–5.90)
RDW: 25.2 % — AB (ref 11.5–14.5)
WBC: 12.6 10*3/uL — AB (ref 3.8–10.6)

## 2016-02-06 LAB — GLUCOSE, CAPILLARY
GLUCOSE-CAPILLARY: 146 mg/dL — AB (ref 65–99)
GLUCOSE-CAPILLARY: 116 mg/dL — AB (ref 65–99)
Glucose-Capillary: 106 mg/dL — ABNORMAL HIGH (ref 65–99)
Glucose-Capillary: 137 mg/dL — ABNORMAL HIGH (ref 65–99)
Glucose-Capillary: 137 mg/dL — ABNORMAL HIGH (ref 65–99)
Glucose-Capillary: 148 mg/dL — ABNORMAL HIGH (ref 65–99)

## 2016-02-06 LAB — TYPE AND SCREEN
ABO/RH(D): O POS
Antibody Screen: NEGATIVE
UNIT DIVISION: 0
Unit division: 0

## 2016-02-06 LAB — COMPREHENSIVE METABOLIC PANEL
ALK PHOS: 81 U/L (ref 38–126)
ALT: 51 U/L (ref 17–63)
AST: 122 U/L — ABNORMAL HIGH (ref 15–41)
Albumin: 2.3 g/dL — ABNORMAL LOW (ref 3.5–5.0)
Anion gap: 6 (ref 5–15)
BILIRUBIN TOTAL: 5.5 mg/dL — AB (ref 0.3–1.2)
BUN: 56 mg/dL — AB (ref 6–20)
CO2: 29 mmol/L (ref 22–32)
Calcium: 8.9 mg/dL (ref 8.9–10.3)
Chloride: 110 mmol/L (ref 101–111)
Creatinine, Ser: 0.61 mg/dL (ref 0.61–1.24)
Glucose, Bld: 117 mg/dL — ABNORMAL HIGH (ref 65–99)
POTASSIUM: 4.1 mmol/L (ref 3.5–5.1)
Sodium: 145 mmol/L (ref 135–145)
TOTAL PROTEIN: 6.7 g/dL (ref 6.5–8.1)

## 2016-02-06 LAB — PROTIME-INR
INR: 1.68
Prothrombin Time: 19.8 seconds — ABNORMAL HIGH (ref 11.4–15.0)

## 2016-02-06 MED ORDER — NOREPINEPHRINE 4 MG/250ML-% IV SOLN
0.0000 ug/min | INTRAVENOUS | Status: DC
  Administered 2016-02-06: 4 ug/min via INTRAVENOUS
  Filled 2016-02-06: qty 250

## 2016-02-06 MED ORDER — MIDAZOLAM HCL 5 MG/ML IJ SOLN
0.0000 mg/h | INTRAMUSCULAR | Status: DC
  Administered 2016-02-06: 0.5 mg/h via INTRAVENOUS
  Administered 2016-02-07 (×2): 10 mg/h via INTRAVENOUS
  Filled 2016-02-06 (×4): qty 10

## 2016-02-06 MED ORDER — FREE WATER
200.0000 mL | Status: DC
  Administered 2016-02-06 – 2016-02-07 (×5): 200 mL

## 2016-02-06 MED ORDER — MAGNESIUM SULFATE 2 GM/50ML IV SOLN
2.0000 g | Freq: Once | INTRAVENOUS | Status: AC
  Administered 2016-02-06: 2 g via INTRAVENOUS

## 2016-02-06 NOTE — Progress Notes (Signed)
Pt very agitated, attempting to sit up in bed and pull at ETT. Fentanyl boluses being given every hour without much relief. Pt on max doses of fentanyl and precidex. Will continue to monitor closely.

## 2016-02-06 NOTE — Progress Notes (Signed)
PULMONARY / CRITICAL CARE MEDICINE   Name: Cameron Torres MRN: [id number] DOB: 1975/09/20    ADMISSION DATE:  01/31/2016 CONSULTATION DATE:  02/02/16  REFERRING MD: Dr. Sheryle Hail  MAJOR EVENTS/TEST RESULTS: 07/16 Admitted to med-surg floor, hospitalist service with history of alcohol abuse, intractable N/V X 2-3 days, jaundice, alcoholic hepatitis, severe anemia (Hgb 6.5), electrolyte derangements 07/16 RUQ [place]: Indeterminate 3.9 cm masslike area located just inferior to the sternum with internal vascularity. While this may potentially represent a mass emanating from the liver, additional etiologies are not excluded and this is indeterminate on ultrasound. This needs dedicated evaluation with contrast-enhanced CT. Hepatic steatosis. Mild nodularity of the liver raising the possibility of cirrhosis 07/16 CTAP: Morphologic changes to the liver suggestive of cirrhosis. Additionally there are findings compatible with portal venous hypertension including marked splenomegaly and re- cannulization of the periumbilical vein. Small amount of ascites within the paracolic gutters and central upper abdomen 07/16 CT chest: Tree-in-bud ground-glass opacities within the superior segment left lower lobe concerning for atypical infectious process. Multiple additional larger ground-glass opacities throughout the lungs bilaterally measuring up to 2.1 cm. 07/16 Transfused one unit PRBCs for Hgb 6.5 07/17 Transfused two units PRBCs for Hgb 5.8 07/18 Transfused one unit PRBCs for Hgb 6.9 07/19 Progressive hypoxemia. Transferred to ICU and intubated. CXR c/w severe ARDS. Required heavy sedation and intermittent NMBs for vent synchrony. Low BP > norepinephrine initiated 07/20 improved gas exchange and CXR 07/21 weaning vasopressors. Transfused one unit PRBCs for Hgb 6.8 07/22 Transfused one unit PRBCs for Hgb 6.9. Decreased responsiveness on WUA. Ammonia 90. Lactulose and rifaximin initiated. PM: very agitated -  fentanyl infusion resumed 07/23 Very agitated despite fentanyl and dex infusions @ max doses. Midazolam infusion resumed. Dex discontinued. Increased bibasilar infiltrates. Low grade fevers. Respiratory culture repeated.   INDWELLING DEVICES:: ETT 07/19 >>  L [place] CVL 07/19 >>   MICRO DATA: Blood 07/16 >> NEG Resp 07/19 >> NEG resp 07/23 >>    PROCALITONIN: 07/20: 11.30 07/21: 7.65 07/22: 4.15  ANTIMICROBIALS:  Levofloxacin 07/16 >> 07/18 Pip-tazo 07/18 >> 07/19 Meropenem 07/19 >> 07/22 Unasyn 07/22 >>    SUBJECTIVE:  Severe agitation. Not F/C  VITAL SIGNS: BP (!) 110/56   Pulse 94   Temp 99.1 F (37.3 C)   Resp 19   Ht  (1.905 m) Comment: per mother  Wt 81.6 kg (179 lb 14.3 oz)   SpO2 (!) 85%   BMI 22.49 kg/m   HEMODYNAMICS: CVP:  [13 mmHg-28 mmHg] 28 mmHg  VENTILATOR SETTINGS: Vent Mode: PCV FiO2 (%):  [40 %-55 %] 55 % Set Rate:  [14 bmp-18 bmp] 18 bmp PEEP:  [8 cmH20] 8 cmH20 Plateau Pressure:  [13 cmH20] 13 cmH20  INTAKE / OUTPUT: I/O last 3 completed shifts: In: 5025 [I.V.:1015; Other:70; NG/GT:3340; IV Piggyback:600] Out: 2713 [Urine:2713]  PHYSICAL EXAMINATION: General: NAD, RASS +2 Neuro: PERRL, MAEs, DTRs symmetric and normal HEENT: Scleral icterus Cardiovascular: RRR s M Lungs: diffuse rhonchi Abdomen: soft, NT, +BS Ext: warm, no edema, +2 pulses Skin: severe jaundice, innumerable telengectasias  LABS:  BMET  Recent Labs Lab 02/05/16 0416 02/05/16 2243 02/06/16 0438  NA 144 143 145  K 4.3 4.3 4.1  CL 108 108 110  CO2 BUN 41* 55* 56*  CREATININE 0.45* 0.74 0.61  GLUCOSE 132* 168* 117*    Electrolytes  Recent Labs Lab 02/02/16 0320  02/04/16 0625 02/05/16 0416 02/05/16 2243 02/06/16 0438  CALCIUM 8.4*  < >  8.0* 8.7* 8.9 8.9  MG 1.5*  < > 1.9 2.0 1.9 2.2  PHOS 1.9*  --  2.4* 2.1*  --   --   < > = values in this interval not displayed.  CBC  Recent Labs Lab 02/04/16 1839 02/05/16 0416  02/06/16 0438  WBC 13.5* 11.8* 12.6*  HGB 7.3* 6.9* 7.8*  HCT 21.5* 20.2* 22.6*  PLT 91* 95* 114*    Coag's  Recent Labs Lab 01/16/2016 1437 02/04/16 0625 02/06/16 0438  APTT  --  37*  --   INR 1.92 1.96 1.68    Sepsis Markers  Recent Labs Lab 02/03/16 0414 02/04/16 0625 02/05/16 0416  PROCALCITON 11.30 7.65 4.15    ABG  Recent Labs Lab 02/02/16 1024 02/03/16 0740 02/05/16 0500  PHART 7.16* 7.35 7.42  PCO2ART 81* 51* 55*  PO2ART 146* 82* 98    Liver Enzymes  Recent Labs Lab 02/03/16 0414 02/04/16 0625 02/06/16 0438  AST 72* 75* 122*  ALT 35 37 51  ALKPHOS 45 57 81  BILITOT 10.3* 7.2* 5.5*  ALBUMIN 2.2* 2.1* 2.3*    Cardiac Enzymes No results for input(s): TROPONINI, PROBNP in the last 168 hours.  Glucose  Recent Labs Lab 02/05/16 1713 02/05/16 1931 02/05/16 2330 02/06/16 0330 02/06/16 0703 02/06/16 1126  GLUCAP 126* 139* 152* 106* 146* 137*    CXR: increased bibasilar opacities - infiltrates vs atelectasis   ASSESSMENT / PLAN:  PULMONARY A: Acute hypoxic respiratory failure Aspiration pneumonitis Severe ARDS Ventilator dyssynchrony, tolerates PCV better than PRVC Worsening gas exchange and CXR 07/23 P:   Cont full vent support - settings reviewed and/or adjusted Cont vent bundle Daily SBT when meets criteria Repeat resp culture 07/23  CARDIOVASCULAR A:  Hypotension - decreasing vasopressor requirements P:  Cont norepinephrine infusion to maintain MAP goal 65 mmHg  RENAL A:   Mild hyponatremia-resolved Mild hypokalemia-resolved Mild hypernatrema P:   Monitor BMET intermittently Monitor I/Os Correct electrolytes as indicated Increase free water 07/23  GASTROINTESTINAL A:   Severe alcoholic cirrhosis Acute alcoholic hepatitis  Severe elevation of LFTs - improving Intractable N/V - resolved P:   SUP: enteral famotidine TF protocol initiated 07/20 Follow LFTs intermittently  HEMATOLOGIC A:   Acute blood  loss anemia Thrombocytopenia Coagulopathy due to liver failure Elevated MCV P:  DVT px: SCDs Monitor CBC intermittently Transfuse per usual guidelines Received vitamin B12 injection 07/21 Cont folic acid supplementation  INFECTIOUS A:   Severe sepsis Aspiration PNA Elevated PCT - improving P:   Monitor temp, WBC count Micro and abx as above No acetaminophen due to severe liver failure Cooling blanket prn for fever With new infiltrates, will recheck PCT AM 07/24  ENDOCRINE A:   Mild hyperglycemia P:   Cont to monitor CBGs  Begin SSI for glu > 180  NEUROLOGIC A:   Acute metabolic encephalopathy 2/2 cirrhosis Alcohol withdrawal syndrome Hepatic encephalopathy Severe agitated delirium P:   RASS goal:  -2, -3 Resume midaz infusion 07/23 Continue fentanyl gtt DC dexmedetomidine  Cont lactulose, rifaximin - initiated 07/22 WUA daily   FAMILY:    CCM time: 40 mins The above time includes time spent in consultation with patient and/or family members and reviewing care plan on multidisciplinary rounds  Billy Fischer, MD PCCM service Mobile [phone] Pager [phone]

## 2016-02-06 NOTE — Progress Notes (Signed)
X-ray confirmed placement of OG tube. Dr. Sung Amabile notified and x-ray results read to him. Dr. Sung Amabile stated that the current placement was correct and the tube is fine to use.

## 2016-02-06 NOTE — Progress Notes (Signed)
Pt vomited once and tube feeds held. OG tube placement checked via auscultation and access remained. Same still secured at 65 at the lip. Pt continued to be very agitated and versed drip was being  titrated up. After approximately 3-4 minutes the OG tube was reassessed and same was 30 at the lip. OG tube removed and a second 26f OG placed. Awaiting x-ray confirmation at this time. Will continue to monitor.

## 2016-02-06 NOTE — Clinical Social Work Note (Signed)
Clinical Social Work Assessment  Patient Details  Name: Cameron Torres MRN: [id number] Date of Birth: Aug 04, 1975  Date of referral:  02/06/16               Reason for consult:  Facility Placement, Substance Use/ETOH Abuse                Permission sought to share information with:  Family Supports, Magazine features editor Permission granted to share information::  Yes, Verbal Permission Granted  Name::     Hilary Hertz [phone]  Agency::  All Facilities  Relationship::  yes  Contact Information:  yes  Housing/Transportation Living arrangements for the past 2 months:  Single Family Home Source of Information:  Parent Patient Interpreter Needed:  None Criminal Activity/Legal Involvement Pertinent to Current Situation/Hospitalization:  No - Comment as needed Significant Relationships:  Dependent Children, Parents Lives with:  Self Do you feel safe going back to the place where you live?  Yes Need for family participation in patient care:  Yes (Comment)  Care giving concerns: LCSW consulted in person with his mother and she reports if he is weak he may benefit from SNF- STR, LCSW explained process and she stated to get thing's started. Her son, once better, may choose to go to rehab SNF or  To have in home rehab. LCSW called [facility] and left a message DSS [employer] to call Mrs Don Broach directly. Patient was fully independent with all his ADL's x4, but due to intubation patient is weak  And will need rehab.  Patient is divorced and has 3 kids ages 73,10, 35 and is very close to his family. He is currently unemployed and has no insurance. If needed he can reside with his mother and have in home PT there but she hopes he can go to [facility] Preference is [facility]. LCSW will put patient  Information and complete Fl2 and send it out to SNF for STR and bed offers. No further needs at this time  Office manager / plan: LCSW   Employment status:   Unemployed Health and safety inspector:   (Medicaid Potential) PT Recommendations:  Not assessed at this time Information / Referral to community resources:   (In future once patient discharge resources for Substance use /Treatment option can be provided)  Patient/Family's Response to care:   She wants her son to get well .  Patient/Family's Understanding of and Emotional Response to Diagnosis, Current Treatment, and Prognosis: According to patients mother this is the sickest she has ever seen her son and hopes he is getting better.  Emotional Assessment Appearance:  Appears stated age Attitude/Demeanor/Rapport:  Unable to Assess Affect (typically observed):  Unable to Assess Orientation:  Oriented to Self, Oriented to Place, Oriented to  Time, Oriented to Situation Alcohol / Substance use:  Alcohol Use, Tobacco Use Psych involvement (Current and /or in the community):  No (Comment)  Discharge Needs  Concerns to be addressed:  Care Coordination Readmission within the last 30 days:  No Current discharge risk:  Lives alone Barriers to Discharge:  Continued Medical Work up   [place], [place], LCSW 02/06/2016, 12:13 PM

## 2016-02-06 NOTE — NC FL2 (Signed)
[place] LEVEL OF CARE SCREENING TOOL     IDENTIFICATION  Patient Name: Cameron Torres Birthdate: 1976-06-07 Sex: male Admission Date (Current Location): 02/12/2016  [place] and [place] Number:  Engineering geologist and Address:  [facility], [address], [place],  [address]      Provider Number: [id number]  Attending Physician Name and Address:  Wilhelmina Mcardle, MD  Relative Name and Phone Number:       Current Level of Care: Hospital Recommended Level of Care: [place] Prior Approval Number:    Date Approved/Denied:   PASRR Number:   [id number]   Discharge Plan: SNF    Current Diagnoses: Patient Active Problem List   Diagnosis Date Noted  . Encounter for central line placement   . Acute posthemorrhagic anemia   . Esophagitis, unspecified   . Gastritis   . Portal hypertension ([place])   . Acute blood loss anemia   . Intractable nausea and vomiting 02/08/2016  . Muscle spasms of neck 11/19/2012  . Sinusitis 08/22/2011  . Hordeolum internum of right lower eyelid 08/22/2011  . Unspecified family circumstance 08/22/2011  . Body aches 03/02/2011  . Insomnia 03/02/2011  . OVERWEIGHT 06/08/2010  . CANNABIS ABUSE, EPISODIC 06/08/2010  . ALCOHOL USE 06/08/2010  . SMOKER 06/08/2010  . LUMBAR STRAIN 06/08/2010    Orientation RESPIRATION BLADDER Height & Weight     Self, Time, Situation, Place  Vent (Prior no O2) Continent Weight: 179 lb 14.3 oz (81.6 kg) Height:  6' 3"  (190.5 cm) (per mother)  BEHAVIORAL SYMPTOMS/MOOD NEUROLOGICAL BOWEL NUTRITION STATUS      Continent Diet (Normal)  AMBULATORY STATUS COMMUNICATION OF NEEDS Skin   Limited Assist Verbally Normal                       Personal Care Assistance Level of Assistance  Bathing, Feeding, Dressing, Total care Bathing Assistance: Limited assistance Feeding assistance: Limited assistance Dressing Assistance: Limited assistance Total  Care Assistance: Limited assistance   Functional Limitations Info  Sight, Hearing, Speech Sight Info: Adequate Hearing Info: Adequate Speech Info: Adequate    SPECIAL CARE FACTORS FREQUENCY  PT (By licensed PT), OT (By licensed OT)     PT Frequency: x5 OT Frequency: x5            Contractures      Additional Factors Info  Code Status Code Status Info: Full Code             Current Medications (02/06/2016):  This is the current hospital active medication list Current Facility-Administered Medications  Medication Dose Route Frequency Provider Last Rate Last Dose  . Ampicillin-Sulbactam (UNASYN) 3 g in sodium chloride 0.9 % 100 mL IVPB  3 g Intravenous Q6H Merilyn Baba, RPH   3 g at 02/06/16 0748  . antiseptic oral rinse solution (CORINZ)  7 mL Mouth Rinse 10 times per day Wilhelmina Mcardle, MD   7 mL at 02/06/16 1200  . chlorhexidine gluconate (SAGE KIT) (PERIDEX) 0.12 % solution 15 mL  15 mL Mouth Rinse BID Harrie Foreman, MD   15 mL at 02/06/16 0747  . dexmedetomidine (PRECEDEX) 400 MCG/100ML (4 mcg/mL) infusion  0-1.4 mcg/kg/hr Intravenous Continuous Wilhelmina Mcardle, MD 29 mL/hr at 02/06/16 1200 1.4 mcg/kg/hr at 02/06/16 1200  . famotidine (PEPCID) 40 MG/5ML suspension 20 mg  20 mg Per Tube BID Wilhelmina Mcardle, MD   20 mg at 02/06/16 0934  .  feeding supplement (PRO-STAT SUGAR FREE 64) liquid 30 mL  30 mL Per Tube BID Wilhelmina Mcardle, MD   30 mL at 02/06/16 0934  . feeding supplement (VITAL AF 1.2 CAL) liquid 1,000 mL  1,000 mL Per Tube Continuous Wilhelmina Mcardle, MD   Stopped at 02/06/16 1200  . fentaNYL (SUBLIMAZE) bolus via infusion 50 mcg  50 mcg Intravenous Q1H PRN Colbert Coyer, MD   50 mcg at 02/06/16 1134  . fentaNYL 2522mg in NS 2515m(1047mml) infusion-PREMIX  25-400 mcg/hr Intravenous Continuous EliColbert CoyerD 40 mL/hr at 02/06/16 1200 400 mcg/hr at 02/06/16 1200  . folic acid (FOLVITE) tablet 1 mg  1 mg Oral Daily DavWilhelmina McardleD   1  mg at 02/06/16 1000  . free water 200 mL  200 mL Per Tube Q4H DavWilhelmina McardleD   Stopped at 02/06/16 1200  . ipratropium-albuterol (DUONEB) 0.5-2.5 (3) MG/3ML nebulizer solution 3 mL  3 mL Nebulization Q6H DavWilhelmina McardleD   3 mL at 02/06/16 0731  . lactulose (CHRONULAC) 10 GM/15ML solution 30 g  30 g Per Tube BID DavWilhelmina McardleD   30 g at 02/06/16 0932  . midazolam (VERSED) 50 mg in sodium chloride 0.9 % 50 mL (1 mg/mL) infusion  0-10 mg/hr Intravenous Continuous DavWilhelmina McardleD 3 mL/hr at 02/06/16 1200 3 mg/hr at 02/06/16 1200  . multivitamin liquid 15 mL  15 mL Per Tube Daily DavWilhelmina McardleD   15 mL at 02/06/16 0933  . norepinephrine (LEVOPHED) 16 mg in dextrose 5 % 250 mL (0.064 mg/mL) infusion  0-40 mcg/min Intravenous Continuous DavWilhelmina McardleD 1.9 mL/hr at 02/06/16 1200 2.027 mcg/min at 02/06/16 1200  . ondansetron (ZOFRAN) injection 4 mg  4 mg Intravenous Once KevHarvest DarkD      . ondansetron (ZOTrios Women'S And Children'S Hospitalnjection 4 mg  4 mg Intravenous Q6H PRN VivHenreitta LeberD      . rifaximin (XIDoreene Nestablet 200 mg  200 mg Per Tube Q8H DavWilhelmina McardleD   200 mg at 02/06/16 1113  . thiamine (B-1) injection 100 mg  100 mg Intravenous Daily MagMikael SprayP   100 mg at 02/06/16 [id number]  Discharge Medications: Please see discharge summary for a list of discharge medications.  Relevant Imaging Results:  Relevant Lab Results:   Additional Information SSN [id number] ReamerCSSouth [place]

## 2016-02-06 NOTE — Progress Notes (Signed)
LCSW met with patient and his mother and sent out his information to different SNF for short term rehab. He may elect to go back to his home with home health.  LCSW called as requested by his mother to call and leave a message for Vance Gather to see if they have started the [facility] application process. Advised Rose to contact mother  Each.  [employer] LCSW [phone]

## 2016-02-06 NOTE — Progress Notes (Signed)
Attempted to titrate down on fentanyl gtt and within 10 minutes, pt. Trying to sit up and pull out foley

## 2016-02-06 NOTE — Progress Notes (Signed)
System Downtime Note:  0100: Pt. Had second run of Vtach (4 beats total) called and spoke with E-link relaying last BMP & Mag levels.  Dr. Darrick Penna ordered 2g of Mag supplementation.  0200: Pt. Became increasingly agitated post starting fentanyl gtt.  Pt. Attempted to self-extubate x 2, moving all over bed attempting to get OOB, pulling at SCD's, foley and safety mitts.  Titrated up to max on fentanyl gtt, removed SCD's and turned off cooling blanket as temp was normalized, repositioned multiple times staying bedside for pt. Safety.  0300: Called RT when pt. sats dropped into high 80's x 2 and maintaining - RT adjusted vent settings and increased FiO2 to 55%.  0400: Pt.'s RASS is at goal of -1, spontaneous eye opening and withdrawing from pain. Will continue to monitor pt. Closely.

## 2016-02-06 NOTE — Progress Notes (Signed)
eLink Physician-Brief Progress Note Patient Name: Cameron Torres DOB: Mar 31, 1976 MRN: [id number]   Date of Service  02/06/2016  HPI/Events of Note  Nursing requesting ammonia order for am   eICU Interventions  ordered     Intervention Category Major Interventions: Delirium, psychosis, severe agitation - evaluation and management  Sandrea Hughs 02/06/2016, 8:28 PM

## 2016-02-07 ENCOUNTER — Inpatient Hospital Stay: Payer: Medicaid Other

## 2016-02-07 ENCOUNTER — Encounter: Payer: Self-pay | Admitting: Adult Health

## 2016-02-07 LAB — COMPREHENSIVE METABOLIC PANEL
ALK PHOS: 83 U/L (ref 38–126)
ALT: 61 U/L (ref 17–63)
ANION GAP: 5 (ref 5–15)
AST: 149 U/L — ABNORMAL HIGH (ref 15–41)
Albumin: 2.2 g/dL — ABNORMAL LOW (ref 3.5–5.0)
BILIRUBIN TOTAL: 5.6 mg/dL — AB (ref 0.3–1.2)
BUN: 64 mg/dL — ABNORMAL HIGH (ref 6–20)
CALCIUM: 8.6 mg/dL — AB (ref 8.9–10.3)
CO2: 28 mmol/L (ref 22–32)
Chloride: 113 mmol/L — ABNORMAL HIGH (ref 101–111)
Creatinine, Ser: 0.81 mg/dL (ref 0.61–1.24)
GLUCOSE: 135 mg/dL — AB (ref 65–99)
Potassium: 4.5 mmol/L (ref 3.5–5.1)
Sodium: 146 mmol/L — ABNORMAL HIGH (ref 135–145)
TOTAL PROTEIN: 6.5 g/dL (ref 6.5–8.1)

## 2016-02-07 LAB — GLUCOSE, CAPILLARY
GLUCOSE-CAPILLARY: 135 mg/dL — AB (ref 65–99)
GLUCOSE-CAPILLARY: 127 mg/dL — AB (ref 65–99)
GLUCOSE-CAPILLARY: 143 mg/dL — AB (ref 65–99)
Glucose-Capillary: 126 mg/dL — ABNORMAL HIGH (ref 65–99)
Glucose-Capillary: 147 mg/dL — ABNORMAL HIGH (ref 65–99)

## 2016-02-07 LAB — BLOOD GAS, ARTERIAL
ACID-BASE EXCESS: 2.2 mmol/L (ref 0.0–3.0)
ALLENS TEST (PASS/FAIL): POSITIVE — AB
Acid-Base Excess: 10.1 mmol/L — ABNORMAL HIGH (ref 0.0–3.0)
Allens test (pass/fail): POSITIVE — AB
BICARBONATE: 28.2 meq/L — AB (ref 21.0–28.0)
Bicarbonate: 35.7 mEq/L — ABNORMAL HIGH (ref 21.0–28.0)
FIO2: 0.6
FIO2: 0.5
O2 SAT: 95.4 %
O2 SAT: 97.7 %
PATIENT TEMPERATURE: 37
PEEP/CPAP: 10 cmH2O
PEEP/CPAP: 10 cmH2O
PH ART: 7.35 (ref 7.350–7.450)
PH ART: 7.42 (ref 7.350–7.450)
PO2 ART: 98 mmHg (ref 83.0–108.0)
PRESSURE CONTROL: 15 cmH2O
Patient temperature: 37
RATE: 35 resp/min
RATE: 14 resp/min
VT: 510 mL
pCO2 arterial: 51 mmHg — ABNORMAL HIGH (ref 32.0–48.0)
pCO2 arterial: 55 mmHg — ABNORMAL HIGH (ref 32.0–48.0)
pO2, Arterial: 82 mmHg — ABNORMAL LOW (ref 83.0–108.0)

## 2016-02-07 LAB — CBC
HEMATOCRIT: 21.8 % — AB (ref 40.0–52.0)
Hemoglobin: 7.1 g/dL — ABNORMAL LOW (ref 13.0–18.0)
MCH: 35.7 pg — ABNORMAL HIGH (ref 26.0–34.0)
MCHC: 32.8 g/dL (ref 32.0–36.0)
MCV: 108.7 fL — ABNORMAL HIGH (ref 80.0–100.0)
Platelets: 124 10*3/uL — ABNORMAL LOW (ref 150–440)
RBC: 2 MIL/uL — ABNORMAL LOW (ref 4.40–5.90)
RDW: 25.8 % — AB (ref 11.5–14.5)
WBC: 15.7 10*3/uL — AB (ref 3.8–10.6)

## 2016-02-07 LAB — PROTIME-INR
INR: 1.66
PROTHROMBIN TIME: 19.6 s — AB (ref 11.4–15.0)

## 2016-02-07 LAB — PROCALCITONIN: PROCALCITONIN: 2.99 ng/mL

## 2016-02-07 LAB — TRIGLYCERIDES: Triglycerides: 178 mg/dL — ABNORMAL HIGH (ref <150)

## 2016-02-07 LAB — AMMONIA: Ammonia: 46 umol/L — ABNORMAL HIGH (ref 9–35)

## 2016-02-07 MED ORDER — FREE WATER
250.0000 mL | Status: DC
  Administered 2016-02-07 – 2016-02-11 (×24): 250 mL

## 2016-02-07 MED ORDER — PROPOFOL 1000 MG/100ML IV EMUL
5.0000 ug/kg/min | INTRAVENOUS | Status: DC
  Administered 2016-02-07: 5 ug/kg/min via INTRAVENOUS
  Administered 2016-02-08 – 2016-02-09 (×7): 40 ug/kg/min via INTRAVENOUS
  Administered 2016-02-09: 50 ug/kg/min via INTRAVENOUS
  Administered 2016-02-09 – 2016-02-10 (×3): 40 ug/kg/min via INTRAVENOUS
  Administered 2016-02-10: 50 ug/kg/min via INTRAVENOUS
  Administered 2016-02-10: 19.9 ug/kg/min via INTRAVENOUS
  Filled 2016-02-07 (×20): qty 100

## 2016-02-07 NOTE — Progress Notes (Signed)
PULMONARY / CRITICAL CARE MEDICINE   Name: Cameron Torres MRN: [id number] DOB: 1976/04/26    ADMISSION DATE:  01/27/2016 CONSULTATION DATE:  02/02/16  REFERRING MD: Dr. Sheryle Hail  MAJOR EVENTS/TEST RESULTS: 07/16 Admitted to med-surg floor, hospitalist service with history of alcohol abuse, intractable N/V X 2-3 days, jaundice, alcoholic hepatitis, severe anemia (Hgb 6.5), electrolyte derangements 07/16 RUQ [place]: Indeterminate 3.9 cm masslike area located just inferior to the sternum with internal vascularity. While this may potentially represent a mass emanating from the liver, additional etiologies are not excluded and this is indeterminate on ultrasound. This needs dedicated evaluation with contrast-enhanced CT. Hepatic steatosis. Mild nodularity of the liver raising the possibility of cirrhosis 07/16 CTAP: Morphologic changes to the liver suggestive of cirrhosis. Additionally there are findings compatible with portal venous hypertension including marked splenomegaly and re- cannulization of the periumbilical vein. Small amount of ascites within the paracolic gutters and central upper abdomen 07/16 CT chest: Tree-in-bud ground-glass opacities within the superior segment left lower lobe concerning for atypical infectious process. Multiple additional larger ground-glass opacities throughout the lungs bilaterally measuring up to 2.1 cm. 07/16 Transfused one unit PRBCs for Hgb 6.5 07/17 Transfused two units PRBCs for Hgb 5.8 07/18 Transfused one unit PRBCs for Hgb 6.9 07/19 Progressive hypoxemia. Transferred to ICU and intubated. CXR c/w severe ARDS. Required heavy sedation and intermittent NMBs for vent synchrony. Low BP > norepinephrine initiated 07/20 improved gas exchange and CXR 07/21 weaning vasopressors. Transfused one unit PRBCs for Hgb 6.8 07/22 Transfused one unit PRBCs for Hgb 6.9. Decreased responsiveness on WUA. Ammonia 90. Lactulose and rifaximin initiated. PM: very agitated -  fentanyl infusion resumed 07/23 Very agitated despite fentanyl and dex infusions @ max doses. Midazolam infusion resumed. Dex discontinued. Increased bibasilar infiltrates. Low grade fevers. Respiratory culture repeated.   INDWELLING DEVICES:: ETT 07/19 >>  L [place] CVL 07/19 >>   MICRO DATA: Blood 07/16 >> NEG Resp 07/19 >> NEG resp 07/23 >>    PROCALITONIN: 07/20: 11.30 07/21: 7.65 07/22: 4.15  ANTIMICROBIALS:  Levofloxacin 07/16 >> 07/18 Pip-tazo 07/18 >> 07/19 Meropenem 07/19 >> 07/22 Unasyn 07/22 >>    SUBJECTIVE:  Improved agitation with Midazolam and fentanyl infusion. PC vent mode without dyssynchrony. Interval increase in lower extremity edema.   VITAL SIGNS: BP (!) 115/50   Pulse 98   Temp 98.4 F (36.9 C)   Resp 16   Ht 6\' 3"  (1.905 m) Comment: per mother  Wt 188 lb 4.4 oz (85.4 kg)   SpO2 97%   BMI 23.53 kg/m   HEMODYNAMICS: CVP:  [12 mmHg-46 mmHg] 17 mmHg  VENTILATOR SETTINGS: Vent Mode: PCV FiO2 (%):  [55 %-70 %] 70 % Set Rate:  [18 bmp] 18 bmp PEEP:  [8 cmH20] 8 cmH20 Plateau Pressure:  [13 cmH20] 13 cmH20  INTAKE / OUTPUT: I/O last 3 completed shifts: In: 5152.7 [I.V.:1702.7; NG/GT:3250; IV Piggyback:200] Out: 1918 [Urine:1918]  PHYSICAL EXAMINATION: General: NAD, RASS +2 Neuro: PERRLA, MAEs, DTRs symmetric and normal HEENT: Scleral icterus, ETT, OGT, oral mucosa moist Cardiovascular: RRR, S1/S2, no MRG Lungs: Bilateral airflow with diffuse rhonchi in all lung fields Abdomen: soft, ND, NT, +BS Ext: warm, +3 edema, +2 pulses Skin: severe jaundice, innumerable telengectasias  LABS:  BMET  Recent Labs Lab 02/05/16 2243 02/06/16 0438 02/07/16 0424  NA 143 145 146*  K 4.3 4.1 4.5  CL 108 110 113*  CO2 28 29 28   BUN 55* 56* 64*  CREATININE 0.74 0.61 0.81  GLUCOSE 168* 117*  135*    Electrolytes  Recent Labs Lab 02/02/16 0320  02/04/16 0625 02/05/16 0416 02/05/16 2243 02/06/16 0438 02/07/16 0424  CALCIUM 8.4*  < > 8.0*  8.7* 8.9 8.9 8.6*  MG 1.5*  < > 1.9 2.0 1.9 2.2  --   PHOS 1.9*  --  2.4* 2.1*  --   --   --   < > = values in this interval not displayed.  CBC  Recent Labs Lab 02/05/16 0416 02/06/16 0438 02/07/16 0424  WBC 11.8* 12.6* 15.7*  HGB 6.9* 7.8* 7.1*  HCT 20.2* 22.6* 21.8*  PLT 95* 114* 124*    Coag's  Recent Labs Lab 02/04/16 0625 02/06/16 0438 02/07/16 0424  APTT 37*  --   --   INR 1.96 1.68 1.66    Sepsis Markers  Recent Labs Lab 02/03/16 0414 02/04/16 0625 02/05/16 0416  PROCALCITON 11.30 7.65 4.15    ABG  Recent Labs Lab 02/02/16 1024 02/03/16 0740 02/05/16 0500  PHART 7.16* 7.35 7.42  PCO2ART 81* 51* 55*  PO2ART 146* 82* 98    Liver Enzymes  Recent Labs Lab 02/04/16 0625 02/06/16 0438 02/07/16 0424  AST 75* 122* 149*  ALT 37 51 61  ALKPHOS 57 81 83  BILITOT 7.2* 5.5* 5.6*  ALBUMIN 2.1* 2.3* 2.2*    Cardiac Enzymes No results for input(s): TROPONINI, PROBNP in the last 168 hours.  Glucose  Recent Labs Lab 02/06/16 0703 02/06/16 1126 02/06/16 1551 02/06/16 2040 02/06/16 2343 02/07/16 0336  GLUCAP 146* 137* 137* 116* 148* 126*    Dg Chest Port 1 View  Result Date: 02/06/2016 CLINICAL DATA:  Respiratory failure EXAM: PORTABLE CHEST 1 VIEW COMPARISON:  February 05, 2016 FINDINGS: The ETT terminates 3 cm above the carina. The left subclavian line is in good position. No pneumothorax. Persistent edema. Increasing bibasilar focal infiltrates. IMPRESSION: 1. Increasing bibasilar infiltrates. Recommend follow-up to resolution. 2. Stable edema. 3. Stable support apparatus. Electronically Signed   By: Gerome Sam III M.D   On: 02/06/2016 11:36  Dg Abd Portable 1v  Result Date: 02/06/2016 CLINICAL DATA:  OG tube placement EXAM: PORTABLE ABDOMEN - 1 VIEW COMPARISON:  02/02/2016 FINDINGS: Enteric tube terminates in the mid gastric body. Patchy bilateral lung opacities, better evaluated on recent chest radiograph. IMPRESSION: Enteric tube  terminates in the gastric body. Electronically Signed   By: Charline Bills M.D.   On: 02/06/2016 12:46   ASSESSMENT / PLAN:  PULMONARY A: Acute hypoxic respiratory failure Aspiration pneumonitis Severe ARDS Ventilator dyssynchrony, tolerates PCV better than PRVC Worsening gas exchange and CXR 07/23 P:   Cont full vent support - settings reviewed and/or adjusted Cont vent bundle Daily SBT when meets criteria F/u 07/23 respiratory culture Continue duoneb  CARDIOVASCULAR A:  Hypotension - decreasing vasopressor requirements P:  Cont norepinephrine infusion to maintain MAP goal 65 mmHg Monitor hemodynamics per ICU protocol  RENAL A:   Mild Hypernatremia P:   Monitor BMET intermittently Monitor I/Os Correct electrolytes as indicated Continue free water flushes  GASTROINTESTINAL A:   Severe alcoholic cirrhosis Acute alcoholic hepatitis  Severe elevation of LFTs - improving Intractable N/V - resolved P:   SUP: enteral famotidine TF protocol initiated 07/20 Continue TFs and titrate to goal  HEMATOLOGIC A:   Acute blood loss anemia Thrombocytopenia Coagulopathy due to liver failure Elevated MCV P:  DVT px: SCDs Monitor CBC intermittently Transfuse per usual guidelines Vitamin B12 injection prn, last dose 07/21 Cont folic acid and thiamine supplementation  INFECTIOUS A:  Severe sepsis-elevated procalcitonin and worsening leukocytosis Aspiration PNA Elevated PCT - improving P:   Monitor temp, WBC count Micro and abx as above No acetaminophen due to severe liver failure Cooling blanket prn for fever Procalcitonin 07/24 is 2.99; will consult ID  ENDOCRINE A:   Mild hyperglycemia P:   Cont to monitor CBGs  Continue SSI for glu > 180  NEUROLOGIC A:   Acute metabolic encephalopathy 2/2 cirrhosis Alcohol withdrawal syndrome Hepatic encephalopathy Severe agitated delirium P:   RASS goal:  -2, -3 Continue midazolam infusion 07/23 Continue  fentanyl gtt Cont lactulose, rifaximin - initiated 07/22 WUA daily   FAMILY: No family at bedside. Will update when available.   Cabrini Ruggieri S. [facility] ANP-BC Pulmonary and Critical Care Medicine [facility] Pager [phone] or [phone]

## 2016-02-07 NOTE — Progress Notes (Signed)
Pharmacy Antibiotic Note  Cameron Torres is a 40 y.o. male admitted on 02/04/2016 with aspiration pneumonia.  Pharmacy has been consulted for ampicillin/sulbactam dosing.  Plan: Will continue Unasyn 3 g iv q 6 hours.   Height: 6\' 3"  (190.5 cm) (per mother) Weight: 188 lb 4.4 oz (85.4 kg) IBW/kg (Calculated) : 84.5  Temp (24hrs), Avg:98.3 F (36.8 C), Min:96.4 F (35.8 C), Max:100.2 F (37.9 C)   Recent Labs Lab 02/04/16 0625 02/04/16 1839 02/05/16 0416 02/05/16 2243 02/06/16 0438 02/07/16 0424  WBC 12.8* 13.5* 11.8*  --  12.6* 15.7*  CREATININE 0.68  --  0.45* 0.74 0.61 0.81    Estimated Creatinine Clearance: 142 mL/min (by C-G formula based on SCr of 0.81 mg/dL).    No Known Allergies  Antimicrobials this admission: Levofloxacin 07/17 >> 07/18 Pip-tazo 07/18 >> 07/19 Meropenem 07/19 >> 07/22 Rifaximin 07/22 >>  Unasyn 07/22 >>   Microbiology results: 7/23 TA: pending 07/16 BCx: NG 07/19 Sputum: NG 07/19 MRSA PCR: Negative  Thank you for allowing pharmacy to be a part of this patient's care.  Luisa Hart, PharmD Clinical Pharmacist  02/07/2016 12:32 PM

## 2016-02-07 NOTE — Care Management (Signed)
Patient remains on vent. RNCM will continue to follow for discharge needs. He is Self-pay listed.

## 2016-02-07 NOTE — Progress Notes (Signed)
PULMONARY / CRITICAL CARE MEDICINE   Name: Cameron Torres MRN: [id number] DOB: Aug 27, 1975    ADMISSION DATE:  02-12-16  The patient is a 40 year old male drinker with alcoholic cirrhosis, now vent dependent with sepsis and aspiration pneumonia. Course  been complicated by hepatic encephalopathy and alcohol withdrawal syndrome.  ASSESSMENT / PLAN:  PULMONARY A: Acute hypoxic respiratory failure Aspiration pneumonitis, due to vomiting. Continued ARDS. Vent settings: Pressure control, rate 22/60%/8/pressure control=23 Chest x-ray 7/24 images reviewed: Continued bibasilar atelectasis and edema. ET tube in adequate position.  P:   Cont full vent support - we'll wean down oxygen as tolerated. Cont vent bundle Does not yet appear to be appropriate for weaning trial due to high FiO2 requirements. Continue duoneb  CARDIOVASCULAR A:  Hypotension - decreasing vasopressor requirements P:  Levofloxacin, currently at 3 mics. Given the patient's cirrhosis and low diastolic blood pressures, we'll wean the Levophed to systolic blood pressure rather than mean arterial pressures.  RENAL A:   Mild Hypernatremia, continues today. P:   Monitor BMET intermittently Monitor I/Os Correct electrolytes as indicated Continue free water flushes  GASTROINTESTINAL A:   Severe alcoholic cirrhosis Acute alcoholic hepatitis, AST elevated now mildly elevated.   P:   SUP: enteral famotidine TF protocol initiated 07/20 Continue TFs and titrate to goal  HEMATOLOGIC A:   Acute blood loss anemia Thrombocytopenia Coagulopathy due to liver failure Macrocytic anemia P:  DVT px: SCDs Monitor CBC intermittently Transfuse per usual guidelines, not currently indicated, we'll continue to monitor hemoglobins. Continue folic acid and thiamine supplementation  INFECTIOUS A:   Severe sepsis-elevated procalcitonin and worsening leukocytosis Aspiration PNA Elevated PCT - improving P:   Monitor  temp, WBC count Continue Unasyn. No acetaminophen due to severe liver failure Cooling blanket prn for fever Procalcitonin 11.3 on 7/20 >>07/24 is 2.99-appears to be decreasing appropriately.  MICRO DATA: Blood 07/16 >> NEG Resp 07/19 >> NEG resp 07/23 >> budding yeast.   PROCALITONIN: 07/20: 11.30 07/21: 7.65 07/22: 4.15 07/24:2.99  ANTIMICROBIALS:  Levofloxacin 07/16 >> 07/18 Pip-tazo 07/18 >> 07/19 Meropenem 07/19 >> 07/22 Unasyn 07/22 >>   ENDOCRINE A:   Hyperglycemia, currently adequately controlled. P:   Continue to monitor CBGs  Continue SSI for glu > 180  NEUROLOGIC A:   Acute metabolic encephalopathy 2/2 cirrhosis Alcohol withdrawal syndrome Hepatic encephalopathy Severe agitated delirium P:   RASS goal:  -2, -3 May need to change on Versed and fentanyl infusions. 2. Propofol. Cont lactulose, rifaximin - initiated 07/22    INDWELLING DEVICES:: ETT 07/19 >>  L [place] CVL 07/19 >>   FAMILY: No family at bedside. Will update when available.  ------------------------  MAJOR EVENTS/TEST RESULTS: 07/16 Admitted to med-surg floor, hospitalist service with history of alcohol abuse, intractable N/V X 2-3 days, jaundice, alcoholic hepatitis, severe anemia (Hgb 6.5), electrolyte derangements 07/16 RUQ [place]: Indeterminate 3.9 cm masslike area located just inferior to the sternum with internal vascularity. While this may potentially represent a mass emanating from the liver, additional etiologies are not excluded and this is indeterminate on ultrasound. This needs dedicated evaluation with contrast-enhanced CT. Hepatic steatosis. Mild nodularity of the liver raising the possibility of cirrhosis 07/16 CTAP: Morphologic changes to the liver suggestive of cirrhosis. Additionally there are findings compatible with portal venous hypertension including marked splenomegaly and re- cannulization of the periumbilical vein. Small amount of ascites within the paracolic gutters and  central upper abdomen 07/16 CT chest: Tree-in-bud ground-glass opacities within the superior segment left lower lobe concerning for  atypical infectious process. Multiple additional larger ground-glass opacities throughout the lungs bilaterally measuring up to 2.1 cm. 07/16 Transfused one unit PRBCs for Hgb 6.5 07/17 Transfused two units PRBCs for Hgb 5.8 07/18 Transfused one unit PRBCs for Hgb 6.9 07/19 Progressive hypoxemia. Transferred to ICU and intubated. CXR c/w severe ARDS. Required heavy sedation and intermittent NMBs for vent synchrony. Low BP > norepinephrine initiated 07/20 improved gas exchange and CXR 07/21 weaning vasopressors. Transfused one unit PRBCs for Hgb 6.8 07/22 Transfused one unit PRBCs for Hgb 6.9. Decreased responsiveness on WUA. Ammonia 90. Lactulose and rifaximin initiated. PM: very agitated - fentanyl infusion resumed 07/23 Very agitated despite fentanyl and dex infusions @ max doses. Midazolam infusion resumed. Dex discontinued. Increased bibasilar infiltrates. Low grade fevers. Respiratory culture repeated.  7/24. Patient sedated adequately, copious stools.   SUBJECTIVE:  Improved agitation with Midazolam and fentanyl infusion. PC vent mode without dyssynchrony. Patient minimally responsive today  VITAL SIGNS: BP (!) 114/47   Pulse 96   Temp 98.8 F (37.1 C)   Resp (!) 21   Ht  (1.905 m) Comment: per mother  Wt 188 lb 4.4 oz (85.4 kg)   SpO2 94%   BMI 23.53 kg/m   HEMODYNAMICS: CVP:  [12 mmHg-46 mmHg] 18 mmHg  VENTILATOR SETTINGS: Vent Mode: PCV FiO2 (%):  [55 %-70 %] 60 % Set Rate:  [18 bmp-22 bmp] 22 bmp PEEP:  [8 cmH20] 8 cmH20 Plateau Pressure:  [25 cmH20] 25 cmH20  INTAKE / OUTPUT: I/O last 3 completed shifts: In: 5978.3 [I.V.:2078.3; Other:180; NG/GT:3120; IV Piggyback:600] Out: 2496 [Urine:2071; Stool:425]  PHYSICAL EXAMINATION: General: NAD, RASS -2 Neuro: PERRLA, MAEs, DTRs symmetric and normal HEENT: Scleral icterus, ETT, OGT,  oral mucosa moist Cardiovascular: RRR, S1/S2, no MRG Lungs: Bilateral airflow , decreased air entry in lower lobes. Abdomen: soft, ND, NT, +BS Ext: warm, +3 edema, +2 pulses Skin: severe jaundice, innumerable telengectasias  LABS:  BMET  Recent Labs Lab 02/05/16 2243 02/06/16 0438 02/07/16 0424  NA 143 145 146*  K 4.3 4.1 4.5  CL 108 110 113*  CO2 BUN 55* 56* 64*  CREATININE 0.74 0.61 0.81  GLUCOSE 168* 117* 135*    Electrolytes  Recent Labs Lab 02/02/16 0320  02/04/16 0625 02/05/16 0416 02/05/16 2243 02/06/16 0438 02/07/16 0424  CALCIUM 8.4*  < > 8.0* 8.7* 8.9 8.9 8.6*  MG 1.5*  < > 1.9 2.0 1.9 2.2  --   PHOS 1.9*  --  2.4* 2.1*  --   --   --   < > = values in this interval not displayed.  CBC  Recent Labs Lab 02/05/16 0416 02/06/16 0438 02/07/16 0424  WBC 11.8* 12.6* 15.7*  HGB 6.9* 7.8* 7.1*  HCT 20.2* 22.6* 21.8*  PLT 95* 114* 124*    Coag's  Recent Labs Lab 02/04/16 0625 02/06/16 0438 02/07/16 0424  APTT 37*  --   --   INR 1.96 1.68 1.66    Sepsis Markers  Recent Labs Lab 02/04/16 0625 02/05/16 0416 02/07/16 0424  PROCALCITON 7.65 4.15 2.99    ABG  Recent Labs Lab 02/02/16 1024 02/03/16 0740 02/05/16 0500  PHART 7.16* 7.35 7.42  PCO2ART 81* 51* 55*  PO2ART 146* 82* 98    Liver Enzymes  Recent Labs Lab 02/04/16 0625 02/06/16 0438 02/07/16 0424  AST 75* 122* 149*  ALT 37 51 61  ALKPHOS 57 81 83  BILITOT 7.2* 5.5* 5.6*  ALBUMIN 2.1* 2.3* 2.2*  Cardiac Enzymes No results for input(s): TROPONINI, PROBNP in the last 168 hours.  Glucose  Recent Labs Lab 02/06/16 1126 02/06/16 1551 02/06/16 2040 02/06/16 2343 02/07/16 0336 02/07/16 0730  GLUCAP 137* 137* 116* 148* 126* 135*    Dg Chest Port 1 View  Result Date: 02/07/2016 CLINICAL DATA:  Patient with respiratory failure. Alcohol abuse. Smoker. EXAM: PORTABLE CHEST 1 VIEW COMPARISON:  Chest radiograph 02/06/2016. FINDINGS: ET tube terminates  in the mid trachea. Subclavian central venous catheter tip projects over the superior vena cava. Enteric tube tip and side-port course inferior to the diaphragm. Stable enlarged cardiac and mediastinal contours. Grossly unchanged diffuse bilateral left-greater-than-right heterogeneous pulmonary opacities. Probable small bilateral pleural effusions. No pneumothorax. IMPRESSION: Stable support apparatus. Grossly unchanged left-greater-than-right diffuse airspace opacities. Electronically Signed   By: Annia Belt M.D.   On: 02/07/2016 07:15  Dg Abd Portable 1v  Result Date: 02/06/2016 CLINICAL DATA:  OG tube placement EXAM: PORTABLE ABDOMEN - 1 VIEW COMPARISON:  02/02/2016 FINDINGS: Enteric tube terminates in the mid gastric body. Patchy bilateral lung opacities, better evaluated on recent chest radiograph. IMPRESSION: Enteric tube terminates in the gastric body. Electronically Signed   By: Charline Bills M.D.   On: 02/06/2016 12:46      Magdalene S. [facility] ANP-BC Pulmonary and Critical Care Medicine [facility] Pager [phone] or [phone]   Pt. Seen and examined with NP, agree with assessment and plan, continue to be sedated on fentanyl and versed with some agitation overnight. Continue lactulose.   Wells Guiles, M.D.  02/07/2016   Critical Care Attestation.  I have personally obtained a history, examined the patient, evaluated laboratory and imaging results, formulated the assessment and plan and placed orders. The Patient requires high complexity decision making for assessment and support, frequent evaluation and titration of therapies, application of advanced monitoring technologies and extensive interpretation of multiple databases. The patient has critical illness that could lead imminently to failure of 1 or more organ systems and requires the highest level of physician preparedness to intervene.  Critical Care Time devoted to patient care services described in this  note is 45 minutes and is exclusive of time spent in procedures.

## 2016-02-07 NOTE — Progress Notes (Signed)
Nutrition Follow-up  DOCUMENTATION CODES:   Not applicable  INTERVENTION:  -Per Dr. Ardyth Man in ICU rounds planning to increase free water flush to q 4 hr today secondary to elevated Na -Continue vital 1.2 at current goal rate with PEP up protocol.  Will need to adjust tube feeding tomorrow pending the addition of diprivan.    NUTRITION DIAGNOSIS:   Inadequate oral intake related to acute illness as evidenced by NPO status.    GOAL:   Provide needs based on ASPEN/SCCM guidelines    MONITOR:   TF tolerance, Vent status, Labs, Weight trends, I & O's  REASON FOR ASSESSMENT:   Ventilator, Consult Enteral/tube feeding initiation and management  ASSESSMENT:      Pt continues on vent.   Pt tolerating vital 1.2 at goal rate of 2ml/hr with prostat BID via OG tube.  Medications reviewed: starting diprivan today per MD Ram in rounds  Labs reviewed: Na 146, glucose 135   Diet Order:     Skin:  Reviewed, no issues  Last BM:  7/18  Height:   Ht Readings from Last 1 Encounters:  02/02/16 6\' 3"  (1.905 m)    Weight:   Wt Readings from Last 1 Encounters:  02/07/16 188 lb 4.4 oz (85.4 kg)    Ideal Body Weight:     BMI:  Body mass index is 23.53 kg/m.  Estimated Nutritional Needs:   Kcal:  2386 kcals  Protein:  125-166 g  Fluid:  >/= 2.5 L  EDUCATION NEEDS:   No education needs identified at this time  Gala Padovano B. Freida Busman, RD, LDN [phone] (pager) Weekend/On-Call pager [phone])

## 2016-02-07 NOTE — Clinical Social Work Note (Signed)
Patient with no insurance and currently with no bed offers. Previous CSW had documented mother willing to take patient home with her at discharge from hospital. Once appropriate, physical therapy will need to be ordered to work with patient. York Spaniel MSW,LCSW [phone]

## 2016-02-08 ENCOUNTER — Inpatient Hospital Stay: Payer: Medicaid Other

## 2016-02-08 LAB — COMPREHENSIVE METABOLIC PANEL
ALT: 64 U/L — ABNORMAL HIGH (ref 17–63)
ANION GAP: 4 — AB (ref 5–15)
AST: 141 U/L — ABNORMAL HIGH (ref 15–41)
Albumin: 2.2 g/dL — ABNORMAL LOW (ref 3.5–5.0)
Alkaline Phosphatase: 93 U/L (ref 38–126)
BILIRUBIN TOTAL: 4.8 mg/dL — AB (ref 0.3–1.2)
BUN: 45 mg/dL — ABNORMAL HIGH (ref 6–20)
CHLORIDE: 115 mmol/L — AB (ref 101–111)
CO2: 33 mmol/L — ABNORMAL HIGH (ref 22–32)
Calcium: 9 mg/dL (ref 8.9–10.3)
Creatinine, Ser: 0.43 mg/dL — ABNORMAL LOW (ref 0.61–1.24)
GFR calc Af Amer: >60 mL/min (ref >60)
Glucose, Bld: 125 mg/dL — ABNORMAL HIGH (ref 65–99)
POTASSIUM: 4.1 mmol/L (ref 3.5–5.1)
Sodium: 152 mmol/L — ABNORMAL HIGH (ref 135–145)
TOTAL PROTEIN: 6.8 g/dL (ref 6.5–8.1)

## 2016-02-08 LAB — CBC
HCT: 22.3 % — ABNORMAL LOW (ref 40.0–52.0)
HEMATOCRIT: 20.9 % — AB (ref 40.0–52.0)
Hemoglobin: 7 g/dL — ABNORMAL LOW (ref 13.0–18.0)
Hemoglobin: 7.4 g/dL — ABNORMAL LOW (ref 13.0–18.0)
MCH: 36.4 pg — ABNORMAL HIGH (ref 26.0–34.0)
MCH: 35.2 pg — AB (ref 26.0–34.0)
MCHC: 33.3 g/dL (ref 32.0–36.0)
MCHC: 33 g/dL (ref 32.0–36.0)
MCV: 109.4 fL — AB (ref 80.0–100.0)
MCV: 106.7 fL — ABNORMAL HIGH (ref 80.0–100.0)
PLATELETS: 140 10*3/uL — AB (ref 150–440)
PLATELETS: 145 10*3/uL — AB (ref 150–440)
RBC: 1.91 MIL/uL — ABNORMAL LOW (ref 4.40–5.90)
RBC: 2.09 MIL/uL — AB (ref 4.40–5.90)
RDW: 27.3 % — AB (ref 11.5–14.5)
RDW: 26.8 % — ABNORMAL HIGH (ref 11.5–14.5)
WBC: 15.1 10*3/uL — AB (ref 3.8–10.6)
WBC: 16.7 10*3/uL — ABNORMAL HIGH (ref 3.8–10.6)

## 2016-02-08 LAB — GLUCOSE, CAPILLARY
GLUCOSE-CAPILLARY: 118 mg/dL — AB (ref 65–99)
GLUCOSE-CAPILLARY: 141 mg/dL — AB (ref 65–99)
GLUCOSE-CAPILLARY: 148 mg/dL — AB (ref 65–99)
GLUCOSE-CAPILLARY: 160 mg/dL — AB (ref 65–99)
Glucose-Capillary: 132 mg/dL — ABNORMAL HIGH (ref 65–99)
Glucose-Capillary: 138 mg/dL — ABNORMAL HIGH (ref 65–99)
Glucose-Capillary: 140 mg/dL — ABNORMAL HIGH (ref 65–99)

## 2016-02-08 LAB — BASIC METABOLIC PANEL
Anion gap: 3 — ABNORMAL LOW (ref 5–15)
BUN: 37 mg/dL — AB (ref 6–20)
CO2: 33 mmol/L — ABNORMAL HIGH (ref 22–32)
CREATININE: 0.38 mg/dL — AB (ref 0.61–1.24)
Calcium: 8.9 mg/dL (ref 8.9–10.3)
Chloride: 116 mmol/L — ABNORMAL HIGH (ref 101–111)
GFR calc Af Amer: >60 mL/min (ref >60)
Glucose, Bld: 138 mg/dL — ABNORMAL HIGH (ref 65–99)
POTASSIUM: 4.4 mmol/L (ref 3.5–5.1)
SODIUM: 152 mmol/L — AB (ref 135–145)

## 2016-02-08 LAB — BLOOD GAS, ARTERIAL
Acid-Base Excess: 10.9 mmol/L — ABNORMAL HIGH (ref 0.0–3.0)
Bicarbonate: 36.1 mEq/L — ABNORMAL HIGH (ref 21.0–28.0)
FIO2: 0.5
O2 SAT: 95.8 %
PCO2 ART: 52 mmHg — AB (ref 32.0–48.0)
PH ART: 7.45 (ref 7.350–7.450)
Patient temperature: 37
pO2, Arterial: 77 mmHg — ABNORMAL LOW (ref 83.0–108.0)

## 2016-02-08 LAB — PREPARE RBC (CROSSMATCH)

## 2016-02-08 MED ORDER — SODIUM CHLORIDE 0.9 % IV SOLN
Freq: Once | INTRAVENOUS | Status: AC
  Administered 2016-02-08: 11:00:00 via INTRAVENOUS

## 2016-02-08 MED ORDER — ALTEPLASE 2 MG IJ SOLR
2.0000 mg | Freq: Once | INTRAMUSCULAR | Status: AC
  Administered 2016-02-08: 2 mg
  Filled 2016-02-08: qty 2

## 2016-02-08 MED ORDER — LACTULOSE 10 GM/15ML PO SOLN
10.0000 g | Freq: Every day | ORAL | Status: DC
  Administered 2016-02-08 – 2016-02-10 (×3): 10 g
  Filled 2016-02-08 (×3): qty 30

## 2016-02-08 MED ORDER — SODIUM CHLORIDE 0.45 % IV SOLN
INTRAVENOUS | Status: DC
  Administered 2016-02-08: 09:00:00 via INTRAVENOUS

## 2016-02-08 NOTE — Progress Notes (Signed)
Wake up assessment completed this morning. Patient able to follow commands with decreased sedation. Patient could move toes, raise arms, and stick out tongue on command. Also able to nod yes and no when asked questions. Per MD orders patient is not ready for extubation at this time, sedation returned to prior dose with RASS score of -2 (goal -2 to -3). Cameron Torres

## 2016-02-08 NOTE — Progress Notes (Signed)
Chaplain rounded the unit to provide a compassionate presence and support to the patient.  The patient seemed to be sleeping and silent prayer was said.  Jefm Petty [phone]

## 2016-02-08 NOTE — Progress Notes (Signed)
Nutrition Follow-up  DOCUMENTATION CODES:   Not applicable  INTERVENTION:  -TF: Recommend continuing Vital AF 1.2 at rate of 70 ml/hr but discontinuation of Prostat BID at present; pt receiving additional kcals from diprivan. Meets 100% of estimated calorie and protein needs at present. Free water flushes per MD order. Continue PEPuP.    NUTRITION DIAGNOSIS:   Inadequate oral intake related to acute illness as evidenced by NPO status.  Being addressed via TF  GOAL:   Provide needs based on ASPEN/SCCM guidelines  MONITOR:   TF tolerance, Vent status, Labs, Weight trends, I & O's  REASON FOR ASSESSMENT:   Ventilator, Consult Enteral/tube feeding initiation and management  ASSESSMENT:     Patient is currently intubated on ventilator support MV: 15.2 L/min Temp (24hrs), Avg:98.7 F (37.1 C), Min:97.1 F (36.2 C), Max:100 F (37.8 C)  Propofol: 15 ml/hr (396 kcals in 24 hours)  Pt with worsening hypernatremia, MD adjusting IVF. Lactulose dose reduced as well as pt with significant stool output  Tolerating Vital AF 1.2 at rate of 70 ml/hr, Prostat BID, free water flushes 250 mL q 4 hours  Diet Order:   NPO, TF  Skin:  Reviewed, no issues  Last BM:  rectal tube in place noted output last 24 hr   Labs: sodium 152, TG 178  Meds: diprivan, 1/2 NS at 100 ml/hr, MVI, lactulose, xifaxan, thiamine, folic acid, fentanyl  Height:   Ht Readings from Last 1 Encounters:  02/08/16 6' 2.02" (1.88 m)    Weight:   Wt Readings from Last 1 Encounters:  02/08/16 188 lb 11.4 oz (85.6 kg)   Filed Weights   02/06/16 0500 02/07/16 0410 02/08/16 0555  Weight: 179 lb 14.3 oz (81.6 kg) 188 lb 4.4 oz (85.4 kg) 188 lb 11.4 oz (85.6 kg)    BMI:  Body mass index is 24.22 kg/m.  Estimated Nutritional Needs:   Kcal:  2386 kcals  Protein:  125-166 g  Fluid:  >/= 2.5 L  EDUCATION NEEDS:   No education needs identified at this time  Romelle Starcher MS, RD, LDN [phone] Pager  [phone] Weekend/On-Call Pager

## 2016-02-08 NOTE — Care Management (Addendum)
Message left of mother Ms. Calvert- [phone] to contact RNCM so that we can stay in touch regarding progression of patient and discharge planning. RN attempting to wean sedation; vented still. Per CSW no current bed offers. Patient is has no health insurance and no income currently per previous discussion with mother. RNCM will continue to follow. Received callback from patient's mother Ms. Calvert. She is aware that there are no current bed offers for patient so that she may start making arrangements (if needed) to take patient home at discharge however, patient is not ready to discharge. Ms. Don Broach will call with any concerns.   Patient mother called back again this evening to talk about disability and medicare. Advised her to call disability office for direction on what to do and maybe contact unemployment office too for direction.

## 2016-02-08 NOTE — Progress Notes (Addendum)
Pharmacy Antibiotic Note  Cameron Torres is a 40 y.o. male admitted on 02/10/2016 with aspiration pneumonia.  Pharmacy has been consulted for ampicillin/sulbactam dosing.  Plan: Patient is currently on day 9 of abx, day 4 of Unasyn. Will f/u planned duration of therapy on rounds.  After discussion with Dr. Nicholos Johns, will d/c antibiotics tomorrow after a total of 10 days of therapy.  Will continue Unasyn 3 g iv q 6 hours.   Height: 6' 2.02" (188 cm) Weight: 188 lb 11.4 oz (85.6 kg) IBW/kg (Calculated) : 82.24  Temp (24hrs), Avg:98.7 F (37.1 C), Min:97.1 F (36.2 C), Max:100 F (37.8 C)   Recent Labs Lab 02/04/16 1839 02/05/16 0416 02/05/16 2243 02/06/16 0438 02/07/16 0424 02/08/16 0518  WBC 13.5* 11.8*  --  12.6* 15.7* 15.1*  CREATININE  --  0.45* 0.74 0.61 0.81 0.43*    Estimated Creatinine Clearance: 139.9 mL/min (by C-G formula based on SCr of 0.8 mg/dL).    No Known Allergies  Antimicrobials this admission: Levofloxacin 07/17 >> 07/18 Pip-tazo 07/18 >> 07/19 Meropenem 07/19 >> 07/22 Rifaximin 07/22 >>  Unasyn 07/22 >>   Microbiology results: 7/23 TA: pending 07/16 BCx: NG 07/19 Sputum: NG 07/19 MRSA PCR: Negative  Thank you for allowing pharmacy to be a part of this patient's care.  Luisa Hart, PharmD Clinical Pharmacist  02/08/2016 9:18 AM

## 2016-02-08 NOTE — Progress Notes (Addendum)
PULMONARY / CRITICAL CARE MEDICINE   Name: Cameron Torres MRN: [id number] DOB: [birth year]    ADMISSION DATE:  02-22-2016  The patient is a 40 year old male drinker with alcoholic cirrhosis, now vent dependent with sepsis and aspiration pneumonia. Course  been complicated by hepatic encephalopathy and alcohol withdrawal syndrome.  ASSESSMENT / PLAN:  PULMONARY A: Acute hypoxic respiratory failure Aspiration pneumonitis, due to vomiting. Resolving ARDS-- decreased Oxygen requirements today. Vent settings: Pressure control, rate equals 22/55, 2%/PEEP 8/pressure control. 10. Blood gas 7.42/55/98/35.7; consistent with compensated hypercapnic respiratory failure. Chest x-ray 7/25 , slightly increased bilateral pulmonary edema, when compared to yesterday's film. Life support devices in adequate position.  P:   Cont full vent support - we'll continue to wean down oxygen as tolerated. Cont vent bundle Still does not yet appear to be appropriate for weaning trial due to high FiO2 requirements. Continue duoneb  CARDIOVASCULAR A:  Hypotension -weaned off pressors overnight P:  Continue to monitor blood pressures.  RENAL A:   Mild Hypernatremia, worsened today. P:   Monitor BMET intermittently Monitor I/Os Correct electrolytes as indicated Continue free water flushes Start half-normal IV saline.  GASTROINTESTINAL A:   Severe alcoholic cirrhosis Acute alcoholic hepatitis, AST elevated now mildly elevated, , improved from yesterday.  P:   SUP: enteral famotidine TF protocol initiated 07/20 Continue TFs and titrate to goal  HEMATOLOGIC A:   Acute blood loss anemia Thrombocytopenia Coagulopathy due to liver failure Macrocytic anemia P:  DVT px: SCDs Monitor CBC intermittently Transfuse per usual guidelines, we'll transfuse 1 unit today we'll continue to monitor hemoglobins. Continue folic acid and thiamine supplementation  INFECTIOUS A:   Severe sepsis-elevated  procalcitonin and  leukocytosis Aspiration PNA Elevated PCT - improved P:   Monitor temp, WBC count Continue Unasyn for cold course of 7 days No acetaminophen due to severe liver failure Cooling blanket prn for fever Procalcitonin 11.3 on 7/20 >>07/24 is 2.99-appears to be decreasing appropriately.  MICRO DATA: Blood 07/16 >> NEG Resp 07/19 >> NEG resp 07/23 >> budding yeast.   PROCALITONIN: 07/20: 11.30 07/21: 7.65 07/22: 4.15 07/24:2.99  ANTIMICROBIALS:  Levofloxacin 07/16 >> 07/18 Pip-tazo 07/18 >> 07/19 Meropenem 07/19 >> 07/22 Unasyn 07/22 >>   ENDOCRINE A:   Hyperglycemia, currently adequately controlled. P:   Continue to monitor CBGs  Continue SSI for glu > 180  NEUROLOGIC A:   Acute metabolic encephalopathy 2/2 cirrhosis, hepatic encephalopathy. Alcohol withdrawal syndrome Hepatic encephalopathy Severe agitated delirium P:   RASS goal:  -2, -3 May need to change on Versed and fentanyl infusions. 2. Propofol. Cont lactulose, rifaximin - initiated 07/22    INDWELLING DEVICES:: ETT 07/19 >>  L [place] CVL 07/19 >>   FAMILY: No family at bedside. Will update when available.  ------------------------  MAJOR EVENTS/TEST RESULTS: 07/16 Admitted to med-surg floor, hospitalist service with history of alcohol abuse, intractable N/V X 2-3 days, jaundice, alcoholic hepatitis, severe anemia (Hgb 6.5), electrolyte derangements 07/16 RUQ [place]: Indeterminate 3.9 cm masslike area located just inferior to the sternum with internal vascularity. While this may potentially represent a mass emanating from the liver, additional etiologies are not excluded and this is indeterminate on ultrasound. This needs dedicated evaluation with contrast-enhanced CT. Hepatic steatosis. Mild nodularity of the liver raising the possibility of cirrhosis 07/16 CTAP: Morphologic changes to the liver suggestive of cirrhosis. Additionally there are findings compatible with portal venous hypertension  including marked splenomegaly and re- cannulization of the periumbilical vein. Small amount of ascites within the paracolic  gutters and central upper abdomen 07/16 CT chest: Tree-in-bud ground-glass opacities within the superior segment left lower lobe concerning for atypical infectious process. Multiple additional larger ground-glass opacities throughout the lungs bilaterally measuring up to 2.1 cm. 07/16 Transfused one unit PRBCs for Hgb 6.5 07/17 Transfused two units PRBCs for Hgb 5.8 07/18 Transfused one unit PRBCs for Hgb 6.9 07/19 Progressive hypoxemia. Transferred to ICU and intubated. CXR c/w severe ARDS. Required heavy sedation and intermittent NMBs for vent synchrony. Low BP > norepinephrine initiated 07/20 improved gas exchange and CXR 07/21 weaning vasopressors. Transfused one unit PRBCs for Hgb 6.8 07/22 Transfused one unit PRBCs for Hgb 6.9. Decreased responsiveness on WUA. Ammonia 90. Lactulose and rifaximin initiated. PM: very agitated - fentanyl infusion resumed 07/23 Very agitated despite fentanyl and dex infusions @ max doses. Midazolam infusion resumed. Dex discontinued. Increased bibasilar infiltrates. Low grade fevers. Respiratory culture repeated.  7/24. Patient sedated adequately, copious stools. 7/25: Weaned off pressors, continued AMS.    SUBJECTIVE:  Improved agitation with Propofol infusion.  Pt on less PC and oxygen than yesterday, still not responsive.   VITAL SIGNS: BP (!) 147/69   Pulse (!) 119   Temp 99.6 F (37.6 C) (Core (Comment))   Resp (!) 32   Ht 6' 2.02" (1.88 m)   Wt 188 lb 11.4 oz (85.6 kg)   SpO2 96%   BMI 24.22 kg/m   HEMODYNAMICS: CVP:  [14 mmHg-31 mmHg] 17 mmHg  VENTILATOR SETTINGS: Vent Mode: PCV FiO2 (%):  [45 %-70 %] 50 % Set Rate:  [22 bmp] 22 bmp PEEP:  [8 cmH20] 8 cmH20 Plateau Pressure:  [25 cmH20] 25 cmH20  INTAKE / OUTPUT: I/O last 3 completed shifts: In: 5692 [I.V.:1402; Other:180; NG/GT:3610; IV Piggyback:500] Out:  1610 [RUEAV:4098; Stool:1526]  PHYSICAL EXAMINATION: General: NAD, RASS -2 Neuro: PERRLA, MAEs, DTRs symmetric and normal HEENT: Scleral icterus, ETT, OGT, oral mucosa moist Cardiovascular: RRR, S1/S2, no MRG Lungs: Bilateral airflow , decreased air entry in lower lobes. Abdomen: soft, ND, NT, +BS Ext: warm, +3 edema, +2 pulses Skin: severe jaundice, innumerable telengectasias  LABS:  BMET  Recent Labs Lab 02/06/16 0438 02/07/16 0424 02/08/16 0518  NA 145 146* 152*  K 4.1 4.5 4.1  CL 110 113* 115*  CO2 29 28 33*  BUN 56* 64* 45*  CREATININE 0.61 0.81 0.43*  GLUCOSE 117* 135* 125*    Electrolytes  Recent Labs Lab 02/02/16 0320  02/04/16 0625 02/05/16 0416 02/05/16 2243 02/06/16 0438 02/07/16 0424 02/08/16 0518  CALCIUM 8.4*  < > 8.0* 8.7* 8.9 8.9 8.6* 9.0  MG 1.5*  < > 1.9 2.0 1.9 2.2  --   --   PHOS 1.9*  --  2.4* 2.1*  --   --   --   --   < > = values in this interval not displayed.  CBC  Recent Labs Lab 02/06/16 0438 02/07/16 0424 02/08/16 0518  WBC 12.6* 15.7* 15.1*  HGB 7.8* 7.1* 7.0*  HCT 22.6* 21.8* 20.9*  PLT 114* 124* 140*    Coag's  Recent Labs Lab 02/04/16 0625 02/06/16 0438 02/07/16 0424  APTT 37*  --   --   INR 1.96 1.68 1.66    Sepsis Markers  Recent Labs Lab 02/04/16 0625 02/05/16 0416 02/07/16 0424  PROCALCITON 7.65 4.15 2.99    ABG  Recent Labs Lab 02/03/16 0740 02/05/16 0500 02/08/16 0500  PHART 7.35 7.42 7.45  PCO2ART 51* 55* 52*  PO2ART 82* 98 77*    Liver  Enzymes  Recent Labs Lab 02/06/16 0438 02/07/16 0424 02/08/16 0518  AST 122* 149* 141*  ALT 51 61 64*  ALKPHOS 81 83 93  BILITOT 5.5* 5.6* 4.8*  ALBUMIN 2.3* 2.2* 2.2*    Cardiac Enzymes No results for input(s): TROPONINI, PROBNP in the last 168 hours.  Glucose  Recent Labs Lab 02/07/16 1055 02/07/16 1629 02/07/16 1959 02/08/16 0009 02/08/16 0406 02/08/16 0715  GLUCAP 147* 127* 143* 148* 118* 140*    Dg Chest 1 View  Result  Date: 02/08/2016 CLINICAL DATA:  Dyspnea.  Intubated.  Sepsis.  Aspiration pneumonia. EXAM: CHEST 1 VIEW COMPARISON:  One-view chest x-ray 02/07/2016 FINDINGS: Endotracheal tube and left subclavian line are stable. The heart size is stable. A diffuse interstitial and airspace pattern is not significantly changed, more significant on the left. Bilateral pleural effusions are again seen. There slight overall increase and diffuse airspace disease. IMPRESSION: 1. Slight increase in diffuse interstitial and airspace disease representing a combination of edema and pneumonia. 2. The support apparatus is stable. Electronically Signed   By: Marin Roberts M.D.   On: 02/08/2016 07:19    -Wells Guiles, M.D.  02/08/2016   Critical Care Attestation.  I have personally obtained a history, examined the patient, evaluated laboratory and imaging results, formulated the assessment and plan and placed orders. The Patient requires high complexity decision making for assessment and support, frequent evaluation and titration of therapies, application of advanced monitoring technologies and extensive interpretation of multiple databases. The patient has critical illness that could lead imminently to failure of 1 or more organ systems and requires the highest level of physician preparedness to intervene.  Critical Care Time devoted to patient care services described in this note is 40 minutes and is exclusive of time spent in procedures.

## 2016-02-08 NOTE — Procedures (Deleted)
  Procedure Note: Right Femoral Central Venous Catheter Placement  Cameron Torres , [id number] , IC12A/IC12A-AA  Indications: Hemodynamic monitoring / Intravenous access  Benefits, risks (including bleeding, infection,  Injury, etc.), and alternatives explained to patient/wife and son who voiced understanding.  Questions were sought and answered.   Family and patient agreed to proceed with the procedure.  Consent is signed and on chart. A time-out was completed verifying correct patient, procedure and site.  A 3 lumen catheter available at the time of procedure.  The patient was placed in a dependent position appropriate for central line placement based on the vein to be cannulated.   The patient's RIGHT Femoral Vein was prepped and draped in a sterile fashion.  1% Lidocaine WAS used to anesthetize the surrounding skin area.   A3 lumen catheter was introduced into the RIGHT Femoral Vein using Seldinger technique, visualized under ultrasound.  The catheter was threaded smoothly over the guide wire and appropriate blood return was obtained.  Each lumen of the catheter was evacuated of air and flushed with sterile saline.  The catheter was then sutured in place to the skin and a sterile dressing applied.  Perfusion to the extremity distal to the point of catheter insertion was checked and found to be adequate.     The patient tolerated the procedure well and there were no complications.  Stephanie Acre, MD [facility] Pager [phone] (please enter 7-digits) On Call Pager - [phone] (please enter 7-digits)

## 2016-02-08 NOTE — Progress Notes (Signed)
Patient RR increased and oxygen saturations decreased. Dr. Dema Severin in patient room during event and ordered to titrate up on patient's fentanyl and for ventilator settings to be modified. Greggory Stallion, RRT in room and made modifications to ventilator. Fentanyl titrated. Cameron Torres

## 2016-02-08 NOTE — Progress Notes (Signed)
Per Dr. Linward Natal start 0.45% NS for 500 mL after blood transfusion finished.  Cameron Torres

## 2016-02-09 ENCOUNTER — Inpatient Hospital Stay: Payer: Medicaid Other

## 2016-02-09 LAB — CBC
HCT: 21.9 % — ABNORMAL LOW (ref 40.0–52.0)
HEMATOCRIT: 20.7 % — AB (ref 40.0–52.0)
HEMOGLOBIN: 6.8 g/dL — AB (ref 13.0–18.0)
Hemoglobin: 6.8 g/dL — ABNORMAL LOW (ref 13.0–18.0)
MCH: 34.1 pg — AB (ref 26.0–34.0)
MCH: 35.1 pg — AB (ref 26.0–34.0)
MCHC: 31 g/dL — ABNORMAL LOW (ref 32.0–36.0)
MCHC: 32.7 g/dL (ref 32.0–36.0)
MCV: 109.9 fL — ABNORMAL HIGH (ref 80.0–100.0)
MCV: 107.2 fL — AB (ref 80.0–100.0)
Platelets: 150 10*3/uL (ref 150–440)
Platelets: 141 10*3/uL — ABNORMAL LOW (ref 150–440)
RBC: 1.99 MIL/uL — AB (ref 4.40–5.90)
RBC: 1.93 MIL/uL — ABNORMAL LOW (ref 4.40–5.90)
RDW: 28.3 % — ABNORMAL HIGH (ref 11.5–14.5)
RDW: 27.8 % — AB (ref 11.5–14.5)
WBC: 18.5 10*3/uL — ABNORMAL HIGH (ref 3.8–10.6)
WBC: 24.7 10*3/uL — ABNORMAL HIGH (ref 3.8–10.6)

## 2016-02-09 LAB — COMPREHENSIVE METABOLIC PANEL
ALK PHOS: 86 U/L (ref 38–126)
ALT: 65 U/L — AB (ref 17–63)
AST: 132 U/L — ABNORMAL HIGH (ref 15–41)
Albumin: 2 g/dL — ABNORMAL LOW (ref 3.5–5.0)
Anion gap: 3 — ABNORMAL LOW (ref 5–15)
BUN: 34 mg/dL — ABNORMAL HIGH (ref 6–20)
CALCIUM: 8.8 mg/dL — AB (ref 8.9–10.3)
CO2: 33 mmol/L — AB (ref 22–32)
CREATININE: 0.48 mg/dL — AB (ref 0.61–1.24)
Chloride: 115 mmol/L — ABNORMAL HIGH (ref 101–111)
GFR calc non Af Amer: >60 mL/min (ref >60)
GLUCOSE: 148 mg/dL — AB (ref 65–99)
Potassium: 4.9 mmol/L (ref 3.5–5.1)
SODIUM: 151 mmol/L — AB (ref 135–145)
Total Bilirubin: 4.1 mg/dL — ABNORMAL HIGH (ref 0.3–1.2)
Total Protein: 6.5 g/dL (ref 6.5–8.1)

## 2016-02-09 LAB — GLUCOSE, CAPILLARY
GLUCOSE-CAPILLARY: 161 mg/dL — AB (ref 65–99)
GLUCOSE-CAPILLARY: 154 mg/dL — AB (ref 65–99)
GLUCOSE-CAPILLARY: 141 mg/dL — AB (ref 65–99)
Glucose-Capillary: 142 mg/dL — ABNORMAL HIGH (ref 65–99)
Glucose-Capillary: 137 mg/dL — ABNORMAL HIGH (ref 65–99)
Glucose-Capillary: 135 mg/dL — ABNORMAL HIGH (ref 65–99)

## 2016-02-09 LAB — MRSA PCR SCREENING: MRSA BY PCR: NEGATIVE

## 2016-02-09 LAB — BASIC METABOLIC PANEL
Anion gap: 3 — ABNORMAL LOW (ref 5–15)
BUN: 41 mg/dL — AB (ref 6–20)
CALCIUM: 8.8 mg/dL — AB (ref 8.9–10.3)
CO2: 35 mmol/L — AB (ref 22–32)
CREATININE: 0.6 mg/dL — AB (ref 0.61–1.24)
Chloride: 116 mmol/L — ABNORMAL HIGH (ref 101–111)
GFR calc Af Amer: >60 mL/min (ref >60)
GFR calc non Af Amer: >60 mL/min (ref >60)
GLUCOSE: 153 mg/dL — AB (ref 65–99)
Potassium: 5.2 mmol/L — ABNORMAL HIGH (ref 3.5–5.1)
Sodium: 154 mmol/L — ABNORMAL HIGH (ref 135–145)

## 2016-02-09 LAB — EXPECTORATED SPUTUM ASSESSMENT W REFEX TO RESP CULTURE

## 2016-02-09 LAB — EXPECTORATED SPUTUM ASSESSMENT W GRAM STAIN, RFLX TO RESP C

## 2016-02-09 LAB — PROTIME-INR
INR: 1.83
PROTHROMBIN TIME: 21.1 s — AB (ref 11.4–15.2)

## 2016-02-09 LAB — PREPARE RBC (CROSSMATCH)

## 2016-02-09 LAB — PROCALCITONIN: PROCALCITONIN: 0.57 ng/mL

## 2016-02-09 MED ORDER — VANCOMYCIN HCL IN DEXTROSE 1-5 GM/200ML-% IV SOLN
1000.0000 mg | Freq: Once | INTRAVENOUS | Status: AC
  Administered 2016-02-09: 1000 mg via INTRAVENOUS
  Filled 2016-02-09: qty 200

## 2016-02-09 MED ORDER — PIPERACILLIN-TAZOBACTAM 3.375 G IVPB
3.3750 g | Freq: Three times a day (TID) | INTRAVENOUS | Status: DC
  Administered 2016-02-09: 3.375 g via INTRAVENOUS
  Filled 2016-02-09 (×3): qty 50

## 2016-02-09 MED ORDER — FUROSEMIDE 10 MG/ML IJ SOLN
40.0000 mg | Freq: Two times a day (BID) | INTRAMUSCULAR | Status: DC
  Administered 2016-02-09 – 2016-02-10 (×4): 40 mg via INTRAVENOUS
  Filled 2016-02-09 (×4): qty 4

## 2016-02-09 MED ORDER — SODIUM CHLORIDE 0.9 % IV SOLN
1.0000 g | Freq: Three times a day (TID) | INTRAVENOUS | Status: DC
  Administered 2016-02-09 – 2016-02-11 (×6): 1 g via INTRAVENOUS
  Filled 2016-02-09 (×9): qty 1

## 2016-02-09 MED ORDER — PHYTONADIONE 1 MG/0.5 ML ORAL SOLUTION
15.0000 mg | Freq: Once | ORAL | Status: AC
  Administered 2016-02-09: 15 mg via ORAL
  Filled 2016-02-09: qty 7.5

## 2016-02-09 MED ORDER — SODIUM CHLORIDE 0.9 % IV SOLN: Freq: Once | INTRAVENOUS | Status: DC

## 2016-02-09 MED ORDER — VANCOMYCIN HCL 10 G IV SOLR
1250.0000 mg | Freq: Three times a day (TID) | INTRAVENOUS | Status: DC
  Administered 2016-02-09 – 2016-02-10 (×4): 1250 mg via INTRAVENOUS
  Filled 2016-02-09 (×6): qty 1250

## 2016-02-09 MED ORDER — VITAMIN B-1 100 MG PO TABS
100.0000 mg | ORAL_TABLET | Freq: Every day | ORAL | Status: DC
  Administered 2016-02-09 – 2016-02-10 (×2): 100 mg via ORAL
  Filled 2016-02-09 (×2): qty 1

## 2016-02-09 NOTE — Progress Notes (Signed)
PULMONARY / CRITICAL CARE MEDICINE   Name: MY SIDEL MRN: [id number] DOB: 11-Dec-1975    ADMISSION DATE:  01/25/2016  The patient is a 39 year old male drinker with alcoholic cirrhosis, now vent dependent with sepsis and aspiration pneumonia. Course  been complicated by hepatic encephalopathy and alcohol withdrawal syndrome, metabolic encephalopathy, coagulopathy.   ASSESSMENT / PLAN:  PULMONARY A: Acute hypoxic respiratory failure Aspiration pneumonitis, due to vomiting. Continued  ARDS-elevated respiratory rate, patient has a thick secretions seen from subglottic suctioning. Vent settings: Pressure control, rate equals 22, 2%/PEEP 8/pressure control. At 22 Blood gas 7.45/52/77/36.1: Still consistent with compensated hypercapnic respiratory failure. Chest x-ray 7/26 , continued bilateral pulmonary edema, when compared to yesterday's film, appears relatively stable. Life support devices in adequate position.  P:   Cont full vent support - we'll continue to wean down oxygen as tolerated. Cont vent bundle Still does not yet appear to be appropriate for weaning trial due to high FiO2 requirements. Continue duoneb We'll check sputum cultures today, will increase antibiotic coverage empirically. Hospital-acquired pneumonia.  CARDIOVASCULAR A:  Blood pressure remained stable weaned off pressors yesterday P:  Continue to monitor blood pressures.  RENAL A:   Mild Hypernatremia, improved today. P:   Monitor BMET intermittently Monitor I/Os Will start IV Lasix for fluid balance. Correct electrolytes as indicated Continue free water flushes Start half-normal IV saline.  GASTROINTESTINAL A:   Severe alcoholic cirrhosis Acute alcoholic hepatitis, AST elevated  improved from yesterday. Coagulopathy due to liver disease, INR/26, 1.83, platelets 150. The patient was given FFP this morning.  P:   SUP: enteral famotidine TF protocol initiated 07/20 Continue TFs and titrate  to goal  HEMATOLOGIC A:   Acute blood loss anemia- Thrombocytopenia Coagulopathy due to liver failure Macrocytic anemia P:  DVT px: SCDs Monitor CBC intermittently Transfuse per usual guidelines, we'll transfuse 1 unit today we'll continue to monitor hemoglobins. Continue folic acid and thiamine supplementation  INFECTIOUS A:   Severe sepsis-elevated procalcitonin and  leukocytosis Aspiration PNA Elevated PCT - improved Leukocytosis with increased white count today.  P:   We'll increase antibiotic coverage empirically. Monitor temp, WBC count Continue Unasyn for cold course of 7 days No acetaminophen due to severe liver failure Cooling blanket prn for fever Procalcitonin 11.3 on 7/20 >>07/24 is 2.99-appears to be decreasing appropriately. We'll recheck calcitonin today.  MICRO DATA: Blood 07/16 >> NEG Resp 07/19 >> NEG resp 07/23 >> budding yeast.   PROCALITONIN: 07/20: 11.30 07/21: 7.65 07/22: 4.15 07/24:2.99  ANTIMICROBIALS:  Levofloxacin 07/16 >> 07/18 Pip-tazo 07/18 >> 07/19 Meropenem 07/19 >> 07/22 Unasyn 07/22 >>   ENDOCRINE A:   Hyperglycemia, currently adequately controlled. P:   Continue to monitor CBGs  Continue SSI for glu > 180  NEUROLOGIC A:   Acute metabolic encephalopathy 2/2 cirrhosis, hepatic encephalopathy. Alcohol withdrawal syndrome Hepatic encephalopathy Severe agitated delirium P:   RASS goal:  -2, -3 May need to change on Versed and fentanyl infusions. 2. Propofol. Cont lactulose, rifaximin - initiated 07/22    INDWELLING DEVICES:: ETT 07/19 >>  L [place] CVL 07/19 >>   FAMILY: No family at bedside. Will update when available.  ------------------------  MAJOR EVENTS/TEST RESULTS: 07/16 Admitted to med-surg floor, hospitalist service with history of alcohol abuse, intractable N/V X 2-3 days, jaundice, alcoholic hepatitis, severe anemia (Hgb 6.5), electrolyte derangements 07/16 RUQ [place]: Indeterminate 3.9 cm masslike area  located just inferior to the sternum with internal vascularity. While this may potentially represent a mass emanating from the  liver, additional etiologies are not excluded and this is indeterminate on ultrasound. This needs dedicated evaluation with contrast-enhanced CT. Hepatic steatosis. Mild nodularity of the liver raising the possibility of cirrhosis 07/16 CTAP: Morphologic changes to the liver suggestive of cirrhosis. Additionally there are findings compatible with portal venous hypertension including marked splenomegaly and re- cannulization of the periumbilical vein. Small amount of ascites within the paracolic gutters and central upper abdomen 07/16 CT chest: Tree-in-bud ground-glass opacities within the superior segment left lower lobe concerning for atypical infectious process. Multiple additional larger ground-glass opacities throughout the lungs bilaterally measuring up to 2.1 cm. 07/16 Transfused one unit PRBCs for Hgb 6.5 07/17 Transfused two units PRBCs for Hgb 5.8 07/18 Transfused one unit PRBCs for Hgb 6.9 07/19 Progressive hypoxemia. Transferred to ICU and intubated. CXR c/w severe ARDS. Required heavy sedation and intermittent NMBs for vent synchrony. Low BP > norepinephrine initiated 07/20 improved gas exchange and CXR 07/21 weaning vasopressors. Transfused one unit PRBCs for Hgb 6.8 07/22 Transfused one unit PRBCs for Hgb 6.9. Decreased responsiveness on WUA. Ammonia 90. Lactulose and rifaximin initiated. PM: very agitated - fentanyl infusion resumed 07/23 Very agitated despite fentanyl and dex infusions @ max doses. Midazolam infusion resumed. Dex discontinued. Increased bibasilar infiltrates. Low grade fevers. Respiratory culture repeated.  7/24. Patient sedated adequately, copious stools. 7/25: Weaned off pressors, continued AMS.  7/26: Pt's respiratory status appears worse today, minimally responsive.    SUBJECTIVE:  Improved agitation with Propofol infusion.  Pt on less PC  and oxygen than yesterday, still not responsive.   VITAL SIGNS: BP (!) 138/59   Pulse (!) 106   Temp 98.6 F (37 C) (Core (Comment))   Resp (!) 22   Ht 6' 2.02" (1.88 m)   Wt 202 lb 2.6 oz (91.7 kg)   SpO2 93%   BMI 25.94 kg/m   HEMODYNAMICS: CVP:  [13 mmHg-15 mmHg] 13 mmHg  VENTILATOR SETTINGS: Vent Mode: PCV FiO2 (%):  [45 %-60 %] 60 % Set Rate:  [22 bmp] 22 bmp PEEP:  [8 cmH20] 8 cmH20  INTAKE / OUTPUT: I/O last 3 completed shifts: In: 7302.9 [P.O.:3; I.V.:3039.9; Blood:300; NG/GT:3560; IV Piggyback:400] Out: 5950 [Urine:5350; Stool:600]  PHYSICAL EXAMINATION: General: NAD, RASS -2, patient adequately sedated, minimally responsive. Neuro: PERRLA, MAEs, DTRs symmetric and normal HEENT: Scleral icterus, ETT, OGT, oral mucosa moist Cardiovascular: RRR, S1/S2, no MRG Lungs: Bilateral airflow , decreased air entry in lower lobes. Bilateral scattered rhonchi present, increased respiratory effort appears more dyspneic today. Abdomen: soft, ND, NT, +BS Ext: warm, +3 edema, +2 pulses Skin: some jaundice, innumerable telengectasias  LABS:  BMET  Recent Labs Lab 02/08/16 0518 02/08/16 1609 02/09/16 0417  NA 152* 152* 151*  K 4.1 4.4 4.9  CL 115* 116* 115*  CO2 33* 33* 33*  BUN 45* 37* 34*  CREATININE 0.43* 0.38* 0.48*  GLUCOSE 125* 138* 148*    Electrolytes  Recent Labs Lab 02/04/16 0625 02/05/16 0416 02/05/16 2243 02/06/16 0438  02/08/16 0518 02/08/16 1609 02/09/16 0417  CALCIUM 8.0* 8.7* 8.9 8.9  < > 9.0 8.9 8.8*  MG 1.9 2.0 1.9 2.2  --   --   --   --   PHOS 2.4* 2.1*  --   --   --   --   --   --   < > = values in this interval not displayed.  CBC  Recent Labs Lab 02/08/16 0518 02/08/16 1609 02/09/16 0417  WBC 15.1* 16.7* 18.5*  HGB 7.0* 7.4* 6.8*  HCT 20.9* 22.3* 21.9*  PLT 140* 145* 150    Coag's  Recent Labs Lab 02/04/16 0625 02/06/16 0438 02/07/16 0424 02/09/16 0417  APTT 37*  --   --   --   INR 1.96 1.68 1.66 1.83     Sepsis Markers  Recent Labs Lab 02/04/16 0625 02/05/16 0416 02/07/16 0424  PROCALCITON 7.65 4.15 2.99    ABG  Recent Labs Lab 02/03/16 0740 02/05/16 0500 02/08/16 0500  PHART 7.35 7.42 7.45  PCO2ART 51* 55* 52*  PO2ART 82* 98 77*    Liver Enzymes  Recent Labs Lab 02/07/16 0424 02/08/16 0518 02/09/16 0417  AST 149* 141* 132*  ALT 61 64* 65*  ALKPHOS 83 93 86  BILITOT 5.6* 4.8* 4.1*  ALBUMIN 2.2* 2.2* 2.0*    Cardiac Enzymes No results for input(s): TROPONINI, PROBNP in the last 168 hours.  Glucose  Recent Labs Lab 02/08/16 1144 02/08/16 1733 02/08/16 1952 02/08/16 2333 02/09/16 0343 02/09/16 0714  GLUCAP 138* 160* 141* 132* 142* 137*    Dg Chest 1 View  Result Date: 02/09/2016 CLINICAL DATA:  40 year old male with dyspnea EXAM: CHEST 1 VIEW COMPARISON:  Chest radiograph dated 02/08/2016 FINDINGS: Endotracheal tube above the carina in stable positioning. Enteric tube extent to the left hemi abdomen with tip beyond the inferior margin of the image. Left-sided PICC in stable position. Stop Single portable view of the chest demonstrate bilateral airspace opacities, grossly similar or slightly increased compared the prior study. There is a small left pleural effusion. No pneumothorax. Stable mild cardiomegaly. There is central vascular prominence compatible with congestive changes. No acute osseous pathology. IMPRESSION: No significant interval change in bilateral airspace opacities representing combination of congestive changes and pneumonia. Stable small left pleural effusion. Follow-up recommended. Electronically Signed   By: Elgie Collard M.D.   On: 02/09/2016 06:01    -Wells Guiles, M.D.  02/09/2016   Critical Care Attestation.  I have personally obtained a history, examined the patient, evaluated laboratory and imaging results, formulated the assessment and plan and placed orders. The Patient requires high complexity decision making for  assessment and support, frequent evaluation and titration of therapies, application of advanced monitoring technologies and extensive interpretation of multiple databases. The patient has critical illness that could lead imminently to failure of 1 or more organ systems and requires the highest level of physician preparedness to intervene.  Critical Care Time devoted to patient care services described in this note is 40 minutes and is exclusive of time spent in procedures.

## 2016-02-09 NOTE — Progress Notes (Signed)
eLink Physician-Brief Progress Note Patient Name: Cameron Torres DOB: [birth year] MRN: [id number]   Date of Service  02/09/2016  HPI/Events of Note  Multiple issues: 1. Hgb = 6.8 and 2. Oozing from central line site / INR = 1.83  eICU Interventions  Will order: 1. Transfuse 1 unit PRBC now. 2. Transfuse 2 units FFP now.      Intervention Category Intermediate Interventions: Coagulopathy - evaluation and management;Other:  Neeti Knudtson Dennard Nip 02/09/2016, 6:29 AM

## 2016-02-09 NOTE — Progress Notes (Signed)
Pharmacy Antibiotic Note  Cameron Torres is a 40 y.o. male admitted on 02/01/2016 with aspiration pneumonia.  Patient with worsening condition and therapy expanded to include meropenem and vancomycin.    Plan: Will start patient on vancomycin 1250mg  IV Q8hr for goal trough of 15-20. Will obtain trough prior to am dose on 07/27.   Will start patient on meropenem 1g IV Q8hr.    Height: 6' 2.02" (188 cm) Weight: 202 lb 2.6 oz (91.7 kg) IBW/kg (Calculated) : 82.24  Temp (24hrs), Avg:98.8 F (37.1 C), Min:97 F (36.1 C), Max:100 F (37.8 C)   Recent Labs Lab 02/06/16 0438 02/07/16 0424 02/08/16 0518 02/08/16 1609 02/09/16 0417  WBC 12.6* 15.7* 15.1* 16.7* 18.5*  CREATININE 0.61 0.81 0.43* 0.38* 0.48*    Estimated Creatinine Clearance: 139.9 mL/min (by C-G formula based on SCr of 0.8 mg/dL).    No Known Allergies  Antimicrobials this admission: Levofloxacin 07/17 >> 07/18 Pip-tazo 07/18 >> 07/19 Meropenem 07/19 >> 07/22 Rifaximin 07/22 >>  Unasyn 07/22 >> 7/26  Zosyn 7/26 x 1 dose  Meropenem 07/26 >> Vancomycin 07/26 >>   Microbiology results: 7/23 TA: pending 07/16 BCx: NG x 5 days  07/19 Sputum: NG 07/19 MRSA PCR: Negative 07/26 Sputum: pending  07/26 MRSA PCR: Negative   Pharmacy will continue to monitor and adjust per consult.    Charlies Silvers 02/09/2016 12:08 PM

## 2016-02-10 ENCOUNTER — Inpatient Hospital Stay: Payer: Medicaid Other

## 2016-02-10 LAB — CBC
HCT: 20.9 % — ABNORMAL LOW (ref 40.0–52.0)
HEMOGLOBIN: 6.5 g/dL — AB (ref 13.0–18.0)
MCH: 34.4 pg — AB (ref 26.0–34.0)
MCHC: 31.1 g/dL — ABNORMAL LOW (ref 32.0–36.0)
MCV: 110.5 fL — ABNORMAL HIGH (ref 80.0–100.0)
PLATELETS: 160 10*3/uL (ref 150–440)
RBC: 1.89 MIL/uL — AB (ref 4.40–5.90)
RDW: 28.6 % — ABNORMAL HIGH (ref 11.5–14.5)
WBC: 32.4 10*3/uL — AB (ref 3.8–10.6)

## 2016-02-10 LAB — CULTURE, RESPIRATORY W GRAM STAIN

## 2016-02-10 LAB — COMPREHENSIVE METABOLIC PANEL
ALBUMIN: 2.2 g/dL — AB (ref 3.5–5.0)
ALK PHOS: 82 U/L (ref 38–126)
ALT: 66 U/L — AB (ref 17–63)
AST: 130 U/L — AB (ref 15–41)
Anion gap: 3 — ABNORMAL LOW (ref 5–15)
BUN: 50 mg/dL — AB (ref 6–20)
CALCIUM: 8.4 mg/dL — AB (ref 8.9–10.3)
CHLORIDE: 113 mmol/L — AB (ref 101–111)
CO2: 34 mmol/L — AB (ref 22–32)
CREATININE: 0.93 mg/dL (ref 0.61–1.24)
GFR calc non Af Amer: >60 mL/min (ref >60)
GLUCOSE: 147 mg/dL — AB (ref 65–99)
Potassium: 6 mmol/L — ABNORMAL HIGH (ref 3.5–5.1)
SODIUM: 150 mmol/L — AB (ref 135–145)
Total Bilirubin: 4.4 mg/dL — ABNORMAL HIGH (ref 0.3–1.2)
Total Protein: 6.9 g/dL (ref 6.5–8.1)

## 2016-02-10 LAB — PROTIME-INR
INR: 1.54
Prothrombin Time: 18.6 seconds — ABNORMAL HIGH (ref 11.4–15.2)

## 2016-02-10 LAB — PREPARE FRESH FROZEN PLASMA
UNIT DIVISION: 0
Unit division: 0

## 2016-02-10 LAB — VANCOMYCIN, TROUGH: Vancomycin Tr: 72 ug/mL (ref 15–20)

## 2016-02-10 LAB — GLUCOSE, CAPILLARY
GLUCOSE-CAPILLARY: 134 mg/dL — AB (ref 65–99)
GLUCOSE-CAPILLARY: 145 mg/dL — AB (ref 65–99)
GLUCOSE-CAPILLARY: 152 mg/dL — AB (ref 65–99)
GLUCOSE-CAPILLARY: 199 mg/dL — AB (ref 65–99)
Glucose-Capillary: 155 mg/dL — ABNORMAL HIGH (ref 65–99)
Glucose-Capillary: 148 mg/dL — ABNORMAL HIGH (ref 65–99)

## 2016-02-10 LAB — CULTURE, RESPIRATORY

## 2016-02-10 LAB — BASIC METABOLIC PANEL
ANION GAP: 10 (ref 5–15)
BUN: 61 mg/dL — ABNORMAL HIGH (ref 6–20)
CHLORIDE: 111 mmol/L (ref 101–111)
CO2: 31 mmol/L (ref 22–32)
CREATININE: 1.82 mg/dL — AB (ref 0.61–1.24)
Calcium: 8.5 mg/dL — ABNORMAL LOW (ref 8.9–10.3)
GFR calc non Af Amer: 44 mL/min — ABNORMAL LOW (ref >60)
GFR, EST AFRICAN AMERICAN: 51 mL/min — AB (ref >60)
Glucose, Bld: 167 mg/dL — ABNORMAL HIGH (ref 65–99)
Potassium: 6.3 mmol/L (ref 3.5–5.1)
SODIUM: 152 mmol/L — AB (ref 135–145)

## 2016-02-10 LAB — CORTISOL: Cortisol, Plasma: 20.7 ug/dL

## 2016-02-10 LAB — HEMOGLOBIN AND HEMATOCRIT, BLOOD
HCT: 21.8 % — ABNORMAL LOW (ref 40.0–52.0)
Hemoglobin: 6.7 g/dL — ABNORMAL LOW (ref 13.0–18.0)

## 2016-02-10 LAB — TRIGLYCERIDES: Triglycerides: 358 mg/dL — ABNORMAL HIGH (ref <150)

## 2016-02-10 LAB — POTASSIUM: POTASSIUM: 7 mmol/L — AB (ref 3.5–5.1)

## 2016-02-10 LAB — PROCALCITONIN: PROCALCITONIN: 0.83 ng/mL

## 2016-02-10 LAB — PREPARE RBC (CROSSMATCH)

## 2016-02-10 LAB — MRSA PCR SCREENING: MRSA by PCR: NEGATIVE

## 2016-02-10 MED ORDER — NOREPINEPHRINE BITARTRATE 1 MG/ML IV SOLN: 2.0000 ug/min | INTRAVENOUS | Status: DC

## 2016-02-10 MED ORDER — DEXTROSE 50 % IV SOLN
25.0000 mL | Freq: Once | INTRAVENOUS | Status: AC
  Administered 2016-02-10: 25 mL via INTRAVENOUS
  Filled 2016-02-10 (×2): qty 50

## 2016-02-10 MED ORDER — SODIUM CHLORIDE 0.9 % IV SOLN: Freq: Once | INTRAVENOUS | Status: DC

## 2016-02-10 MED ORDER — NOREPINEPHRINE 4 MG/250ML-% IV SOLN
2.0000 ug/min | INTRAVENOUS | Status: DC
  Administered 2016-02-10: 2 ug/min via INTRAVENOUS
  Filled 2016-02-10 (×2): qty 250

## 2016-02-10 MED ORDER — INSULIN ASPART 100 UNIT/ML ~~LOC~~ SOLN
10.0000 [IU] | Freq: Once | SUBCUTANEOUS | Status: AC
  Administered 2016-02-10: 10 [IU] via INTRAVENOUS
  Filled 2016-02-10: qty 10

## 2016-02-10 MED ORDER — OCTREOTIDE LOAD VIA INFUSION
50.0000 ug | Freq: Once | INTRAVENOUS | Status: AC
  Administered 2016-02-10: 50 ug via INTRAVENOUS
  Filled 2016-02-10: qty 25

## 2016-02-10 MED ORDER — INSULIN REGULAR HUMAN 100 UNIT/ML IJ SOLN
10.0000 [IU] | Freq: Once | INTRAMUSCULAR | Status: DC
  Administered 2016-02-10: 10 [IU] via INTRAVENOUS
  Filled 2016-02-10: qty 0.1

## 2016-02-10 MED ORDER — VANCOMYCIN HCL 10 G IV SOLR
1250.0000 mg | INTRAVENOUS | Status: DC
  Filled 2016-02-10: qty 1250

## 2016-02-10 MED ORDER — SODIUM POLYSTYRENE SULFONATE 15 GM/60ML PO SUSP
30.0000 g | Freq: Once | ORAL | Status: AC
  Administered 2016-02-10: 30 g via ORAL
  Filled 2016-02-10 (×2): qty 120

## 2016-02-10 MED ORDER — SODIUM CHLORIDE 0.9 % IV SOLN
8.0000 mg/h | INTRAVENOUS | Status: DC
  Administered 2016-02-10 – 2016-02-11 (×3): 8 mg/h via INTRAVENOUS
  Filled 2016-02-10 (×3): qty 80

## 2016-02-10 MED ORDER — SODIUM CHLORIDE 0.9 % IV SOLN
50.0000 ug/h | INTRAVENOUS | Status: DC
  Administered 2016-02-10 – 2016-02-11 (×3): 50 ug/h via INTRAVENOUS
  Filled 2016-02-10 (×6): qty 1

## 2016-02-10 MED ORDER — SODIUM POLYSTYRENE SULFONATE 15 GM/60ML PO SUSP
30.0000 g | Freq: Once | ORAL | Status: AC
  Administered 2016-02-10: 30 g via ORAL
  Filled 2016-02-10: qty 120

## 2016-02-10 MED ORDER — PANTOPRAZOLE SODIUM 40 MG IV SOLR
40.0000 mg | Freq: Two times a day (BID) | INTRAVENOUS | Status: DC
Start: 2016-02-13 — End: 2016-02-11

## 2016-02-10 MED ORDER — SODIUM CHLORIDE 0.9 % IV SOLN
80.0000 mg | Freq: Once | INTRAVENOUS | Status: AC
  Administered 2016-02-10: 80 mg via INTRAVENOUS
  Filled 2016-02-10: qty 80

## 2016-02-10 NOTE — Progress Notes (Signed)
Pharmacy Antibiotic Note  Cameron Torres is a 40 y.o. male admitted on Feb 08, 2016 with aspiration pneumonia.  Patient with worsening condition and therapy expanded to include meropenem and vancomycin.    Plan: Will start patient on vancomycin 1250mg  IV Q8hr for goal trough of 15-20. Will obtain trough prior to am dose on 07/27.   Will start patient on meropenem 1g IV Q8hr.    Height: 6' 2.02" (188 cm) Weight: 213 lb 6.5 oz (96.8 kg) IBW/kg (Calculated) : 82.24  Temp (24hrs), Avg:98.5 F (36.9 C), Min:97.6 F (36.4 C), Max:99.5 F (37.5 C)   Recent Labs Lab 02/08/16 0518 02/08/16 1609 02/09/16 0417 02/09/16 1744 02/10/16 0500 02/10/16 1552 02/10/16 1648  WBC 15.1* 16.7* 18.5* 24.7* 32.4*  --   --   CREATININE 0.43* 0.38* 0.48* 0.60* 0.93 1.82*  --   VANCOTROUGH  --   --   --   --   --   --  72*    Estimated Creatinine Clearance: 61.5 mL/min (by C-G formula based on SCr of 1.82 mg/dL).    No Known Allergies  Antimicrobials this admission: Levofloxacin 07/17 >> 07/18 Pip-tazo 07/18 >> 07/19 Meropenem 07/19 >> 07/22 Rifaximin 07/22 >>  Unasyn 07/22 >> 7/26  Zosyn 7/26 x 1 dose  Meropenem 07/26 >> Vancomycin 07/26 >>   Microbiology results: 7/23 TA: pending 07/16 BCx: NG x 5 days  07/19 Sputum: NG 07/19 MRSA PCR: Negative 07/26 Sputum: pending  07/26 MRSA PCR: Negative   7/27 Vanc trough resulted at 56mcg/ml, verified with RN that trough was drawn prior to dose being given. Noted that MD note says that patient is anuric so this is probably a valid level. Will check a random Vanc level in 24 hr (7/28 @ 16:00). I have entered an order for Vanc 1250mg  IV q48h more as a place holder.  Clovia Cuff, PharmD, BCPS 02/10/2016 7:22 PM

## 2016-02-10 NOTE — Progress Notes (Signed)
eLink Physician-Brief Progress Note Patient Name: Cameron Torres DOB: [birth year] MRN: [id number]   Date of Service  02/10/2016  HPI/Events of Note  Hgb=6.5  eICU Interventions  1 unit of PRBC ordered     Intervention Category Intermediate Interventions: Bleeding - evaluation and treatment with blood products  Reyansh Kushnir 02/10/2016, 6:01 AM

## 2016-02-10 NOTE — Care Management (Signed)
Spoke with patient's mother again (Ms. Don Broach [phone]) regarding patient continued care and lack of progress. She is aware that he is full code but "does not want him to suffer any more". She would like to meet with palliative in case patient does not recover. Message left with Fausto Skillern nursing of mother's request.

## 2016-02-10 NOTE — Progress Notes (Signed)
PULMONARY / CRITICAL CARE MEDICINE   Name: DELYNN PURSLEY MRN: [id number] DOB: 01-Apr-1976    ADMISSION DATE:  01/24/2016 The patient is a 40 year old male drinker with alcoholic cirrhosis, now vent dependent with sepsis and aspiration pneumonia. Course  been complicated by hepatic encephalopathy and alcohol withdrawal syndrome, metabolic encephalopathy, coagulopathy.   ASSESSMENT / PLAN:  PULMONARY A: Acute hypoxic respiratory failure Aspiration pneumonitis, due to vomiting. Continued  ARDS-elevated respiratory rate, patient has thick secretions seen from subglottic suctioning. Vent settings: PCV/22/8/70%  Chest x-ray 7/27 , continued bilateral pulmonary edema, when compared to yesterday's film, appears slightly improved. Life support devices in adequate position.  P:   Cont full vent support - we'll continue to wean down oxygen as tolerated. Cont vent bundle Still does not yet appear to be appropriate for weaning trial due to high FiO2 requirements. Continue duoneb Await sputum culture results.  Hospital-acquired pneumonia.  CARDIOVASCULAR A:  Blood pressure remained stable weaned off pressors yesterday P:  Continue to monitor blood pressures.  RENAL A:   Mild Hypernatremia, improved today down to 150 from 154 P:   Monitor BMET intermittently Monitor I/Os Will start IV Lasix for fluid balance. Correct electrolytes as indicated Continue free water flushes Start half-normal IV saline.  GASTROINTESTINAL A:   Severe alcoholic cirrhosis Black stools seen in stool bag with reduced hb, suspect GI bleeding.  Gastrointestinal varices.  Acute alcoholic hepatitis, AST elevated improved from yesterday. Coagulopathy due to liver disease, INR/26, 1.4, platelets 160.   P:   SUP: enteral famotidine TF protocol initiated 07/20 Continue TFs Re-Consulted GI.  --Will start octreotide, protonix drips.  --Lactulose decreased due to diarrhea.   HEMATOLOGIC A:   Acute blood  loss anemia- Thrombocytopenia Coagulopathy due to liver failure Macrocytic anemia P:  DVT px: SCDs Monitor CBC intermittently Transfuse per usual guidelines, we'll transfuse 1 unit today we'll continue to monitor hemoglobins. Continue folic acid and thiamine supplementation  INFECTIOUS A:   Severe sepsis-elevated procalcitonin and  leukocytosis Aspiration PNA Elevated PCT - improved Leukocytosis with increased white count today.  P:   We'll increase antibiotic coverage empirically. Monitor temp, WBC count Continue abx.  No acetaminophen due to severe liver failure   MICRO DATA: Blood 07/16 >> NEG Resp 07/19 >> NEG resp 07/23 >> budding yeast.   PROCALITONIN: 07/20: 11.30 07/21: 7.65 07/22: 4.15 07/24:2.99  ANTIMICROBIALS:  Levofloxacin 07/16 >> 07/18 Pip-tazo 07/18 >> 07/19 Meropenem 07/19 >> 07/22 Unasyn 07/22 >>   ENDOCRINE A:   Hyperglycemia, currently adequately controlled. P:   Continue to monitor CBGs  Continue SSI for glu > 180  NEUROLOGIC A:   Acute metabolic encephalopathy 2/2 cirrhosis, hepatic encephalopathy. Alcohol withdrawal syndrome Hepatic encephalopathy Severe agitated delirium P:   RASS goal:  -2, -3 May need to change on Versed and fentanyl infusions. 2. Propofol. lactulose, rifaximin - initiated 07/22    INDWELLING DEVICES:: ETT 07/19 >>  L [place] CVL 07/19 >>   FAMILY: No family at bedside. Will update when available.  ------------------------  MAJOR EVENTS/TEST RESULTS: 07/16 Admitted to med-surg floor, hospitalist service with history of alcohol abuse, intractable N/V X 2-3 days, jaundice, alcoholic hepatitis, severe anemia (Hgb 6.5), electrolyte derangements 07/16 RUQ [place]: Indeterminate 3.9 cm masslike area located just inferior to the sternum with internal vascularity. While this may potentially represent a mass emanating from the liver, additional etiologies are not excluded and this is indeterminate on ultrasound. This needs  dedicated evaluation with contrast-enhanced CT. Hepatic steatosis. Mild nodularity of the  liver raising the possibility of cirrhosis 07/16 CTAP: Morphologic changes to the liver suggestive of cirrhosis. Additionally there are findings compatible with portal venous hypertension including marked splenomegaly and re- cannulization of the periumbilical vein. Small amount of ascites within the paracolic gutters and central upper abdomen 07/16 CT chest: Tree-in-bud ground-glass opacities within the superior segment left lower lobe concerning for atypical infectious process. Multiple additional larger ground-glass opacities throughout the lungs bilaterally measuring up to 2.1 cm. 07/16 Transfused one unit PRBCs for Hgb 6.5 07/17 Transfused two units PRBCs for Hgb 5.8 07/18 Transfused one unit PRBCs for Hgb 6.9 07/19 Progressive hypoxemia. Transferred to ICU and intubated. CXR c/w severe ARDS. Required heavy sedation and intermittent NMBs for vent synchrony. Low BP > norepinephrine initiated 07/20 improved gas exchange and CXR 07/21 weaning vasopressors. Transfused one unit PRBCs for Hgb 6.8 07/22 Transfused one unit PRBCs for Hgb 6.9. Decreased responsiveness on WUA. Ammonia 90. Lactulose and rifaximin initiated. PM: very agitated - fentanyl infusion resumed 07/23 Very agitated despite fentanyl and dex infusions @ max doses. Midazolam infusion resumed. Dex discontinued. Increased bibasilar infiltrates. Low grade fevers. Respiratory culture repeated.  7/24. Patient sedated adequately, copious stools. 7/25: Weaned off pressors, continued AMS.  7/26: Pt's respiratory status appears worse today, minimally responsive.  7/27: Continued anemia, transfused, restarted octreotide and protonix drips.   SUBJECTIVE:  Improved agitation with Propofol infusion.  Pt on less PC and oxygen than yesterday, still not responsive.   VITAL SIGNS: BP (!) 99/37   Pulse 90   Temp 97.8 F (36.6 C) (Oral)   Resp (!) 25   Ht  6' 2.02" (1.88 m)   Wt 213 lb 6.5 oz (96.8 kg)   SpO2 95%   BMI 27.39 kg/m   HEMODYNAMICS: CVP:  [12 mmHg-28 mmHg] 17 mmHg  VENTILATOR SETTINGS: Vent Mode: PCV FiO2 (%):  [60 %-70 %] 70 % Set Rate:  [22 bmp] 22 bmp PEEP:  [8 cmH20] 8 cmH20 Plateau Pressure:  [12 cmH20] 12 cmH20  INTAKE / OUTPUT: I/O last 3 completed shifts: In: 6625.1 [I.V.:2860.2; Blood:322.9; NG/GT:2292; IV Piggyback:1150] Out: 5300 [Urine:4100; Stool:1200]  PHYSICAL EXAMINATION: General: NAD, RASS -2, patient adequately sedated, minimally responsive. Neuro: PERRLA, MAEs, DTRs symmetric and normal HEENT: Scleral icterus, ETT, OGT, oral mucosa moist Cardiovascular: RRR, S1/S2, no MRG Lungs: Bilateral airflow , decreased air entry in lower lobes. Bilateral scattered rhonchi present, increased respiratory effort appears more dyspneic today. Abdomen: soft, ND, NT, +BS Ext: warm, +3 edema, +2 pulses Skin: some jaundice, innumerable telengectasias  LABS:  BMET  Recent Labs Lab 02/09/16 0417 02/09/16 1744 02/10/16 0500  NA 151* 154* 150*  K 4.9 5.2* 6.0*  CL 115* 116* 113*  CO2 33* 35* 34*  BUN 34* 41* 50*  CREATININE 0.48* 0.60* 0.93  GLUCOSE 148* 153* 147*    Electrolytes  Recent Labs Lab 02/04/16 0625 02/05/16 0416 02/05/16 2243 02/06/16 0438  02/09/16 0417 02/09/16 1744 02/10/16 0500  CALCIUM 8.0* 8.7* 8.9 8.9  < > 8.8* 8.8* 8.4*  MG 1.9 2.0 1.9 2.2  --   --   --   --   PHOS 2.4* 2.1*  --   --   --   --   --   --   < > = values in this interval not displayed.  CBC  Recent Labs Lab 02/09/16 0417 02/09/16 1744 02/10/16 0500  WBC 18.5* 24.7* 32.4*  HGB 6.8* 6.8* 6.5*  HCT 21.9* 20.7* 20.9*  PLT 150 141* 160  Coag's  Recent Labs Lab 02/04/16 0625  02/07/16 0424 02/09/16 0417 02/10/16 0500  APTT 37*  --   --   --   --   INR 1.96  < > 1.66 1.83 1.54  < > = values in this interval not displayed.  Sepsis Markers  Recent Labs Lab 02/07/16 0424 02/09/16 0854  02/10/16 0500  PROCALCITON 2.99 0.57 0.83    ABG  Recent Labs Lab 02/05/16 0500 02/08/16 0500  PHART 7.42 7.45  PCO2ART 55* 52*  PO2ART 98 77*    Liver Enzymes  Recent Labs Lab 02/08/16 0518 02/09/16 0417 02/10/16 0500  AST 141* 132* 130*  ALT 64* 65* 66*  ALKPHOS 93 86 82  BILITOT 4.8* 4.1* 4.4*  ALBUMIN 2.2* 2.0* 2.2*    Cardiac Enzymes No results for input(s): TROPONINI, PROBNP in the last 168 hours.  Glucose  Recent Labs Lab 02/09/16 1128 02/09/16 1616 02/09/16 1935 02/09/16 2325 02/10/16 0325 02/10/16 0703  GLUCAP 161* 154* 135* 141* 145* 134*    Dg Chest 1 View  Result Date: 02/09/2016 CLINICAL DATA:  History of cirrhosis no ventilator dependent with sepsis and aspiration pneumonia. Hepatic encephalopathy alcohol withdrawal symptoms. Metabolic encephalopathy, Coagulopathy. EXAM: CHEST 1 VIEW COMPARISON:  02/09/2016 and 02/08/2016 FINDINGS: Nasogastric tube and endotracheal tubes unchanged. Left subclavian central venous catheter unchanged. Lungs are adequately inflated demonstrate persistent patchy hazy opacification bilaterally worse over the left perihilar region without significant change. Possible small amount left pleural fluid. Mild stable cardiomegaly. Remainder of the exam is unchanged. IMPRESSION: Hazy patchy bilateral airspace process worse over the left perihilar region without significant change. Findings may be due to combination of interstitial edema and infection versus ARDS. Stable cardiomegaly. Tubes and lines unchanged. Electronically Signed   By: Elberta Fortis M.D.   On: 02/09/2016 09:53    -Wells Guiles, M.D.  02/10/2016   Critical Care Attestation.  I have personally obtained a history, examined the patient, evaluated laboratory and imaging results, formulated the assessment and plan and placed orders. The Patient requires high complexity decision making for assessment and support, frequent evaluation and titration of therapies,  application of advanced monitoring technologies and extensive interpretation of multiple databases. The patient has critical illness that could lead imminently to failure of 1 or more organ systems and requires the highest level of physician preparedness to intervene.  Critical Care Time devoted to patient care services described in this note is 40 minutes and is exclusive of time spent in procedures.

## 2016-02-10 NOTE — Progress Notes (Signed)
0800 Dana NP notified of potassium level of 6. Lab requested to repeat test. 0900 Potassium level repeat of 7. Dr. Ardyth Man notified. Also notified of dark stools and Hgb of 6.7. 1 un it of RBCs hung per order.  Started on Protonix drip and Sandostatin drip. 1130  Dr. Sung Amabile in. informed of potassium level of 7 and no urine output. Given D50 41ml and insulin 10 units IV. Kaexalate 30mg  given per OG tube.Bladder scan done- 68ml opf urine noted. Foley irrigated with Sterile normal saline and sterile technique. Foley removed and replaced by 65F foley catheter. Potassium level repeated at 5pm. Potassium level 6.3- [place] NP notified of level. Repeated Kaeexalate 30mg  at 1800.

## 2016-02-10 NOTE — Significant Event (Signed)
Cameron Torres has deteriorated significantly in the last couple of days despite maximum support. He is now anuric and hyperkalemic. His mother had already spoken with Care Manager earlier today and expressed desire to move towards a palliative approach. I spoke with pt's mother and we discussed the above. She wishes for DNR status and desires to forgo hemodialysis understanding that the likelihood of a good survival @ this point is vanishingly small. I have changed his Code Status to DNR. The mother is to gather other loved ones with the likely plan of discontinuation of vent 01/30/2016  Billy Fischer, MD PCCM service Mobile [phone] Pager [phone] 02/10/2016

## 2016-02-11 ENCOUNTER — Inpatient Hospital Stay: Payer: Medicaid Other

## 2016-02-11 LAB — COMPREHENSIVE METABOLIC PANEL
ALT: 72 U/L — AB (ref 17–63)
ANION GAP: 5 (ref 5–15)
AST: 146 U/L — ABNORMAL HIGH (ref 15–41)
Albumin: 2.3 g/dL — ABNORMAL LOW (ref 3.5–5.0)
Alkaline Phosphatase: 100 U/L (ref 38–126)
BUN: 76 mg/dL — ABNORMAL HIGH (ref 6–20)
CHLORIDE: 110 mmol/L (ref 101–111)
CO2: 32 mmol/L (ref 22–32)
CREATININE: 2.8 mg/dL — AB (ref 0.61–1.24)
Calcium: 7.5 mg/dL — ABNORMAL LOW (ref 8.9–10.3)
GFR, EST AFRICAN AMERICAN: 30 mL/min — AB (ref >60)
GFR, EST NON AFRICAN AMERICAN: 26 mL/min — AB (ref >60)
Glucose, Bld: 151 mg/dL — ABNORMAL HIGH (ref 65–99)
POTASSIUM: 7.2 mmol/L — AB (ref 3.5–5.1)
Sodium: 147 mmol/L — ABNORMAL HIGH (ref 135–145)
Total Bilirubin: 4.3 mg/dL — ABNORMAL HIGH (ref 0.3–1.2)
Total Protein: 7.6 g/dL (ref 6.5–8.1)

## 2016-02-11 LAB — CBC
HCT: 23.6 % — ABNORMAL LOW (ref 40.0–52.0)
Hemoglobin: 7.1 g/dL — ABNORMAL LOW (ref 13.0–18.0)
MCH: 33.9 pg (ref 26.0–34.0)
MCHC: 30.1 g/dL — AB (ref 32.0–36.0)
MCV: 112.7 fL — AB (ref 80.0–100.0)
PLATELETS: 228 10*3/uL (ref 150–440)
RBC: 2.09 MIL/uL — ABNORMAL LOW (ref 4.40–5.90)
RDW: 26.8 % — AB (ref 11.5–14.5)
WBC: 46.1 10*3/uL — ABNORMAL HIGH (ref 3.8–10.6)

## 2016-02-11 LAB — POTASSIUM: POTASSIUM: 6.6 mmol/L — AB (ref 3.5–5.1)

## 2016-02-11 LAB — GLUCOSE, CAPILLARY
GLUCOSE-CAPILLARY: 186 mg/dL — AB (ref 65–99)
Glucose-Capillary: 137 mg/dL — ABNORMAL HIGH (ref 65–99)
Glucose-Capillary: 152 mg/dL — ABNORMAL HIGH (ref 65–99)

## 2016-02-11 LAB — PROCALCITONIN: Procalcitonin: 1.41 ng/mL

## 2016-02-11 MED ORDER — DEXTROSE 50 % IV SOLN
50.0000 mL | Freq: Once | INTRAVENOUS | Status: AC
  Administered 2016-02-11: 50 mL via INTRAVENOUS
  Filled 2016-02-11: qty 50

## 2016-02-11 MED ORDER — MORPHINE SULFATE (PF) 4 MG/ML IV SOLN
4.0000 mg | Freq: Once | INTRAVENOUS | Status: AC
  Administered 2016-02-11: 4 mg via INTRAVENOUS
  Filled 2016-02-11: qty 1

## 2016-02-11 MED ORDER — MIDAZOLAM HCL 2 MG/2ML IJ SOLN
INTRAMUSCULAR | Status: AC
  Administered 2016-02-11: 2 mg via INTRAVENOUS
  Filled 2016-02-11: qty 2

## 2016-02-11 MED ORDER — MIDAZOLAM HCL 2 MG/2ML IJ SOLN
2.0000 mg | Freq: Once | INTRAMUSCULAR | Status: AC
  Administered 2016-02-11: 2 mg via INTRAVENOUS

## 2016-02-11 MED ORDER — LORAZEPAM 2 MG/ML PO CONC
1.0000 mg | ORAL | Status: DC | PRN
  Filled 2016-02-11: qty 0.5

## 2016-02-11 MED ORDER — GLYCOPYRROLATE 1 MG PO TABS
1.0000 mg | ORAL_TABLET | ORAL | Status: DC | PRN
  Filled 2016-02-11: qty 1

## 2016-02-11 MED ORDER — DEXTROSE 5 % IV SOLN
2.0000 ug/min | INTRAVENOUS | Status: DC
  Administered 2016-02-11: 32 ug/min via INTRAVENOUS
  Administered 2016-02-11: 50 ug/min via INTRAVENOUS
  Administered 2016-02-11: 46 ug/min via INTRAVENOUS
  Filled 2016-02-11 (×2): qty 16

## 2016-02-11 MED ORDER — MORPHINE 100MG IN NS 100ML (1MG/ML) PREMIX INFUSION
4.0000 mg/h | INTRAVENOUS | Status: DC
  Administered 2016-02-11: 4 mg/h via INTRAVENOUS
  Filled 2016-02-11: qty 100

## 2016-02-11 MED ORDER — INSULIN REGULAR HUMAN 100 UNIT/ML IJ SOLN
10.0000 [IU] | Freq: Once | INTRAMUSCULAR | Status: AC
  Administered 2016-02-11: 10 [IU] via INTRAVENOUS
  Filled 2016-02-11: qty 0.1

## 2016-02-11 MED ORDER — GLYCOPYRROLATE 0.2 MG/ML IJ SOLN: 0.2000 mg | INTRAMUSCULAR | Status: DC | PRN

## 2016-02-11 MED ORDER — LORAZEPAM 2 MG/ML IJ SOLN: 1.0000 mg | INTRAMUSCULAR | Status: DC | PRN

## 2016-02-11 MED ORDER — LORAZEPAM 0.5 MG PO TABS: 1.0000 mg | ORAL_TABLET | ORAL | Status: DC | PRN

## 2016-02-12 LAB — TYPE AND SCREEN
ABO/RH(D): O POS
Antibody Screen: NEGATIVE
UNIT DIVISION: 0
Unit division: 0
Unit division: 0

## 2016-02-14 LAB — CULTURE, RESPIRATORY W GRAM STAIN

## 2016-02-15 NOTE — Discharge Summary (Signed)
Physician Discharge Summary  Patient ID: Cameron Torres MRN: [id number] DOB/AGE: 07-20-1975 40 y.o.  Admit date: 02/15/16 Discharge date: 02/02/2016    Discharge Diagnoses:  Acute Hypoxic Respiratory Failure secondary to Aspiration Pneumonia and Severe ARDS Hypotension secondary to Sepsis Hyperkalemia Hyponatremia  Severe Alcoholic Cirrhosis Acute Alcoholic Hepatitis Severe Anemia likely secondary to acute blood loss concerning for Upper GI Bleed Thrombocytopenia Coagulopathy due to Liver Failure Severe Sepsis secondary to Pneumonia Acute encephalopathy Alcohol Withdrawal Syndrome                                                                              DISCHARGE SUMMARY   Cameron Torres is a 40 y.o. y/o male with a PMH of OCD, Lower Back Pain, Headache, Depression, and Alcohol Abuse.  He was admitted to the med surg floor on 02/15/2016 to hospitalist service with c/o intractable nausea and vomiting x 2-3 days, jaundice, alcoholic hepatitis, severe anemia hgb 6.6, and electrolyte derangements. On 7/19 pt transferred to ICU due to progressive hypoxemia requiring mechanical intubation.  PCCM consulted on 7/19 for management of acute hypoxic respiratory failure secondary to aspiration pneumonia and severe ARDS. While intubated pt required heavy sedation and intermittent paralytics for vent dyssynchrony.  He declined further on 02/10/2016 despite maximum support developing worsening leukocytosis.  He became anuric and hyperkalemic, despite interventions his mother did NOT want pt to receive dialysis.  He continued to have persistent anemia despite multiple pRBC's and FFP transfusions.  Based on overall prognosis his mother decided to change code status to DNR and proceed with comfort care on 01/15/2016  SIGNIFICANT DIAGNOSTIC STUDIES 07/16 RUQ US>>Indeterminate 3.9 cm masslike area located just inferior to the sternum with internal vascularity. While this may potentially represent a  mass emanating from the liver, additional etiologies are not excluded and this is indeterminate on ultrasound. This needs dedicated evaluation with contrast-enhanced CT. Hepatic steatosis. Mild nodularity of the liver raising the possibility of cirrhosis 07/16 CTAP>>Morphologic changes to the liver suggestive of cirrhosis. Additionally there are findings compatible with portal venous hypertension including marked splenomegaly and re- cannulization of the periumbilical vein. Small amount of ascites within the paracolic gutters and central upper abdomen 07/16 CT chest>>Tree-in-bud ground-glass opacities within the superior segment left lower lobe concerning for atypical infectious process. Multiple additional larger ground-glass opacities throughout the lungs bilaterally measuring up to 2.1 cm. 7/18 Upper GI Endoscopy>>LA Grade A esophagitis, Gastritis, Portal hypertensive gastropathy, Normal examined duodenum  SIGNIFICANT EVENTS 7/19-Transferred to ICU and intubated PCCM consulted due to acute respiratory failure secondary to aspiration pneumonia and severe ARDS 7/28>>Terminal extubation and pt comfort care  7/28>>Pt expired at 12:40 pm  MICRO DATA  Sputum 7/26>>moderate yeast, few candida species, not albicans 7/28 Blood 7/16>>negative 7/21   ANTIBIOTICS Levofloxacin 7/16>>7/18 Zosyn 7/18>>7/19 Meropenem 7/19>>7/28 Vancomycin 7/27>>7/28  CONSULTS Gastrointestinal  Intensivist   TUBES / LINES: ETT 7/19>>7/28 L [place] CVL 7/19>>7/28 OG tube 7/19>>7/28   Discharge Exam: General: NAD, RASS -2, patient adequately sedated, minimally responsive. Neuro: PERRLA, MAEs, DTRs symmetric and normal HEENT: Scleral icterus, ETT, OGT, oral mucosa moist Cardiovascular: RRR, S1/S2, no MRG Lungs: coarse and diminished throughout, increased respiratory effort appears more dyspneic today Abdomen: soft, ND, NT, +  BS x4 Ext: warm, 3+ generalized edema, +2 pulses Skin: some jaundice, innumerable  telengectasias   Vitals:   2016-02-18 1000 02-18-16 1100 2016/02/18 1128 02-18-2016 1200  BP: (!) 96/33 (!) 92/33  (!) 88/33  Pulse: 81 94  94  Resp: (!) 30 (!) 30  (!) 30  Temp:      TempSrc:      SpO2: 92% 97% 96% 97%  Weight:    222 lb 0.1 oz (100.7 kg)  Height:         Discharge Labs  BMET  Recent Labs Lab 02/05/16 0416 02/05/16 2243 02/06/16 0438  02/09/16 0417 02/09/16 1744 02/10/16 0500  02/10/16 1552 2016/02/18 0005 02/18/16 0540  NA 144 143 145  < > 151* 154* 150*  --  152*  --  147*  K 4.3 4.3 4.1  < > 4.9 5.2* 6.0*  < > 6.3* 6.6* 7.2*  CL 108 108 110  < > 115* 116* 113*  --  111  --  110  CO2 31 28 29   < > 33* 35* 34*  --  31  --  32  GLUCOSE 132* 168* 117*  < > 148* 153* 147*  --  167*  --  151*  BUN 41* 55* 56*  < > 34* 41* 50*  --  61*  --  76*  CREATININE 0.45* 0.74 0.61  < > 0.48* 0.60* 0.93  --  1.82*  --  2.80*  CALCIUM 8.7* 8.9 8.9  < > 8.8* 8.8* 8.4*  --  8.5*  --  7.5*  MG 2.0 1.9 2.2  --   --   --   --   --   --   --   --   PHOS 2.1*  --   --   --   --   --   --   --   --   --   --   < > = values in this interval not displayed.  CBC  Recent Labs Lab 02/09/16 1744 02/10/16 0500 02/10/16 1648 2016-02-18 0540  HGB 6.8* 6.5* 6.7* 7.1*  HCT 20.7* 20.9* 21.8* 23.6*  WBC 24.7* 32.4*  --  46.1*  PLT 141* 160  --  228    Anti-Coagulation  Recent Labs Lab 02/06/16 0438 02/07/16 0424 02/09/16 0417 02/10/16 0500  INR 1.68 1.66 1.83 1.54            Medication List    ASK your doctor about these medications   loratadine 10 MG tablet Commonly known as:  CLARITIN Take 10 mg by mouth daily.   naproxen 500 MG tablet Commonly known as:  NAPROSYN Take one po bid x 1 week then prn pain, take with food        Disposition:   Cameron Torres was made comfort care on 02-18-16 per mothers request and deceased at 12:40 pm with family at bedside

## 2016-02-15 NOTE — Progress Notes (Signed)
Spoke with patients mother 01/16/2016 discussed poor prognosis and continued deterioration throughout the night.  She stated she is waiting on additional family and does not want aggressive treatment she would like patient to be made comfort care when the rest of the family arrives at bedside.  Informed RN Enrique Sack of plan will withdraw care today 01/20/2016  Sonda Rumble, AGNP  Pulmonary/Critical Care Pager [phone] (please enter 7 digits) PCCM Consult Pager [phone] (please enter 7 digits)

## 2016-02-15 NOTE — Progress Notes (Signed)
PULMONARY / CRITICAL CARE MEDICINE   Name: Cameron Torres MRN: [id number] DOB: [birth year]    ADMISSION DATE:  02/12/2016 The patient is a 40 year old male drinker with alcoholic cirrhosis, now vent dependent with sepsis and aspiration pneumonia. Course  been complicated by hepatic encephalopathy and alcohol withdrawal syndrome, metabolic encephalopathy, coagulopathy.   ASSESSMENT / PLAN:  PULMONARY A: Acute hypoxic respiratory failure Aspiration pneumonitis, due to vomiting. Continued  ARDS-elevated respiratory rate, patient has thick secretions seen from subglottic suctioning. Vent settings: PCV/30/10/100% P:   Cont full vent support for now will plan for terminal extubation 7/28 Cont vent bundle Continue Duoneb for now   CARDIOVASCULAR A:  Hypotension P:  Continue levophed gtt to maintain MAP >60 Telemetry monitoring   RENAL A:   Mild Hypernatremia, improving Hyperkalemia P:   Per pts mother she does NOT want dialysis started on pt Will not obtain labs   GASTROINTESTINAL A:   Severe alcoholic cirrhosis Black stools seen in stool bag with reduced hgb, suspect GI bleeding.  Gastrointestinal varices.  Acute alcoholic hepatitis Coagulopathy due to liver disease, INR 1.54, platelets 228   P:   SUP: enteral famotidine Continue TF's Continue octreotide, protonix drips.    HEMATOLOGIC A:   Acute blood loss anemia- Thrombocytopenia Coagulopathy due to liver failure Macrocytic anemia P:  DVT px: SCDs Monitor CBC intermittently Monitor for s/sx of bleeding Continue folic acid and thiamine supplementation  INFECTIOUS A:   Severe sepsis-elevated procalcitonin and  leukocytosis Aspiration PNA Elevated PCT - improved Leukocytosis with increased white count today. P:   Monitor temp, WBC count Continue abx.  No acetaminophen due to severe liver failure   MICRO DATA: Blood 07/16 >> NEG Resp 07/19 >> NEG resp 07/23 >> budding yeast.    PROCALITONIN: 07/20: 11.30 07/21: 7.65 07/22: 4.15 07/24:2.99  ANTIMICROBIALS:  Levofloxacin 07/16 >> 07/18 Pip-tazo 07/18 >> 07/19 Meropenem 07/19 >>  Unasyn 07/22 >>d/c  ENDOCRINE A:   Hyperglycemia, currently adequately controlled. P:   Continue to monitor CBGs for now  Continue SSI for glu > 180  NEUROLOGIC A:   Acute metabolic encephalopathy 2/2 cirrhosis, hepatic encephalopathy. Alcohol withdrawal syndrome Hepatic encephalopathy Severe agitated delirium P:   RASS goal:  -2, -3 Continue Fentanyl and Propofol gtts until transitioned to comfort care  Lactulose, Rifaximin - initiated 07/22    INDWELLING DEVICES:: ETT 07/19 >>  L [place] CVL 07/19 >>   FAMILY: No family at bedside. Will update when available.  ------------------------  MAJOR EVENTS/TEST RESULTS: 07/16 Admitted to med-surg floor, hospitalist service with history of alcohol abuse, intractable N/V X 2-3 days, jaundice, alcoholic hepatitis, severe anemia (Hgb 6.5), electrolyte derangements 07/16 RUQ [place]: Indeterminate 3.9 cm masslike area located just inferior to the sternum with internal vascularity. While this may potentially represent a mass emanating from the liver, additional etiologies are not excluded and this is indeterminate on ultrasound. This needs dedicated evaluation with contrast-enhanced CT. Hepatic steatosis. Mild nodularity of the liver raising the possibility of cirrhosis 07/16 CTAP: Morphologic changes to the liver suggestive of cirrhosis. Additionally there are findings compatible with portal venous hypertension including marked splenomegaly and re- cannulization of the periumbilical vein. Small amount of ascites within the paracolic gutters and central upper abdomen 07/16 CT chest: Tree-in-bud ground-glass opacities within the superior segment left lower lobe concerning for atypical infectious process. Multiple additional larger ground-glass opacities throughout the lungs bilaterally measuring  up to 2.1 cm. 07/16 Transfused one unit PRBCs for Hgb 6.5 07/17 Transfused two units PRBCs  for Hgb 5.8 07/18 Transfused one unit PRBCs for Hgb 6.9 07/19 Progressive hypoxemia. Transferred to ICU and intubated. CXR c/w severe ARDS. Required heavy sedation and intermittent NMBs for vent synchrony. Low BP > norepinephrine initiated 07/20 improved gas exchange and CXR 07/21 weaning vasopressors. Transfused one unit PRBCs for Hgb 6.8 07/22 Transfused one unit PRBCs for Hgb 6.9. Decreased responsiveness on WUA. Ammonia 90. Lactulose and rifaximin initiated. PM: very agitated - fentanyl infusion resumed 07/23 Very agitated despite fentanyl and dex infusions @ max doses. Midazolam infusion resumed. Dex discontinued. Increased bibasilar infiltrates. Low grade fevers. Respiratory culture repeated.  7/24. Patient sedated adequately, copious stools. 7/25: Weaned off pressors, continued AMS.  7/26: Pt's respiratory status appears worse today, minimally responsive.  7/27: Continued anemia, transfused, restarted octreotide and protonix drips.   SUBJECTIVE:  Improved agitation with Propofol infusion.  Pt on less PC and oxygen than yesterday, still not responsive.   VITAL SIGNS: BP (!) 89/34   Pulse 88   Temp 99.2 F (37.3 C) (Axillary)   Resp (!) 25   Ht 6' 2.02" (1.88 m)   Wt 222 lb 0.1 oz (100.7 kg)   SpO2 94%   BMI 28.49 kg/m   HEMODYNAMICS:    VENTILATOR SETTINGS: Vent Mode: PCV FiO2 (%):  [70 %-100 %] 100 % Set Rate:  [22 bmp] 22 bmp PEEP:  [8 cmH20] 8 cmH20 Pressure Support:  [8 cmH20] 8 cmH20 Plateau Pressure:  [21 cmH20] 21 cmH20  INTAKE / OUTPUT: I/O last 3 completed shifts: In: 6056.8 [I.V.:3276.8; Blood:290; NG/GT:1840; IV Piggyback:650] Out: 2240 [Urine:740; Stool:1500]  PHYSICAL EXAMINATION: General: NAD, RASS -2, patient adequately sedated, minimally responsive. Neuro: PERRLA, MAEs, DTRs symmetric and normal HEENT: Scleral icterus, ETT, OGT, oral mucosa  moist Cardiovascular: RRR, S1/S2, no MRG Lungs: coarse and diminished throughout, increased respiratory effort appears more dyspneic today Abdomen: soft, ND, NT, +BS x4 Ext: warm, 3+ generalized edema, +2 pulses Skin: some jaundice, innumerable telengectasias  LABS:  BMET  Recent Labs Lab 02/10/16 0500  02/10/16 1552 01/22/2016 0005 01/15/2016 0540  NA 150*  --  152*  --  147*  K 6.0*  < > 6.3* 6.6* 7.2*  CL 113*  --  111  --  110  CO2 34*  --  31  --  32  BUN 50*  --  61*  --  76*  CREATININE 0.93  --  1.82*  --  2.80*  GLUCOSE 147*  --  167*  --  151*  < > = values in this interval not displayed.  Electrolytes  Recent Labs Lab 02/05/16 0416 02/05/16 2243 02/06/16 0438  02/10/16 0500 02/10/16 1552 02/14/2016 0540  CALCIUM 8.7* 8.9 8.9  < > 8.4* 8.5* 7.5*  MG 2.0 1.9 2.2  --   --   --   --   PHOS 2.1*  --   --   --   --   --   --   < > = values in this interval not displayed.  CBC  Recent Labs Lab 02/09/16 1744 02/10/16 0500 02/10/16 1648 01/30/2016 0540  WBC 24.7* 32.4*  --  46.1*  HGB 6.8* 6.5* 6.7* 7.1*  HCT 20.7* 20.9* 21.8* 23.6*  PLT 141* 160  --  228    Coag's  Recent Labs Lab 02/07/16 0424 02/09/16 0417 02/10/16 0500  INR 1.66 1.83 1.54    Sepsis Markers  Recent Labs Lab 02/09/16 0854 02/10/16 0500 01/22/2016 0540  PROCALCITON 0.57 0.83 1.41    ABG  Recent Labs Lab 02/05/16 0500 02/08/16 0500  PHART 7.42 7.45  PCO2ART 55* 52*  PO2ART 98 77*    Liver Enzymes  Recent Labs Lab 02/09/16 0417 02/10/16 0500 02/10/2016 0540  AST 132* 130* 146*  ALT 65* 66* 72*  ALKPHOS 86 82 100  BILITOT 4.1* 4.4* 4.3*  ALBUMIN 2.0* 2.2* 2.3*    Cardiac Enzymes No results for input(s): TROPONINI, PROBNP in the last 168 hours.  Glucose  Recent Labs Lab 02/10/16 1105 02/10/16 1637 02/10/16 1944 02/10/16 2331 01/19/2016 0335 01/26/2016 0723  GLUCAP 155* 148* 152* 199* 186* 137*    Dg Chest Port 1 View  Result Date: 01/19/2016 CLINICAL  DATA:  Acute respiratory failure. EXAM: PORTABLE CHEST 1 VIEW COMPARISON:  Radiograph of February 10, 2016. FINDINGS: Stable cardiomegaly. Endotracheal and nasogastric tubes are unchanged in position. Left subclavian catheter is unchanged with tip in expected position of left brachycephalic vein. Stable bilateral perihilar and basilar interstitial densities are noted consistent with pulmonary edema. Stable left pleural effusion is noted. No pneumothorax is noted. IMPRESSION: Stable support apparatus. Stable cardiomegaly and bilateral interstitial densities consistent with pulmonary edema. Stable left pleural effusion. Electronically Signed   By: Lupita Raider, M.D.   On: 02/13/2016 10:06  -Per pts mother's request will plan to make pt comfort care today 02/14/2016 once family arrives at bedside.  Sonda Rumble, AGNP  Pulmonary/Critical Care Pager [phone] (please enter 7 digits) PCCM Consult Pager [phone] (please enter 7 digits)  Pt. Seen and examined with NP, agree with assessment and plan. Pt has continue to decline with increased pressor and oxygen requirement. Bilateral creps on lung exam, increased infiltration noted on review of CXR today.  On discussion with pt's mother it has been decided that family will withdral care today and allow pt to pass away.   Wells Guiles, M.D.   02/07/2016   Critical Care Attestation.  I have personally obtained a history, examined the patient, evaluated laboratory and imaging results, formulated the assessment and plan and placed orders. The Patient requires high complexity decision making for assessment and support, frequent evaluation and titration of therapies, application of advanced monitoring technologies and extensive interpretation of multiple databases. The patient has critical illness that could lead imminently to failure of 1 or more organ systems and requires the highest level of physician preparedness to intervene.  Critical Care Time  devoted to patient care services described in this note is 45 minutes and is exclusive of time spent in procedures.

## 2016-02-15 NOTE — Progress Notes (Signed)
Chaplain visited with family to provide a compassionate presence and support at time of patient's passing. Jefm Petty [phone]

## 2016-02-15 NOTE — Progress Notes (Signed)
MR. Depaula pronounced dead at 1240 pm. Mom and son at bedside at time of death. No cardiac heart tones or breath sounds heard during auscultation. Pronounced dead by Enrique Sack, RN and Oneita Jolly, RN. Dr. Nicholos Johns aware of death. Pt does not qualify for organ donation. Referral Number [id number] Spoke with Lana Fish at [facility].

## 2016-02-15 NOTE — Progress Notes (Signed)
Nutrition Brief Note  Chart reviewed. Pt now transitioning to comfort care. NPO.  No further nutrition interventions warranted at this time. Will sign off. Please re-consult as needed.   Romelle Starcher MS, RD, LDN [phone] Pager  [phone] Weekend/On-Call Pager

## 2016-02-15 NOTE — Clinical Social Work Note (Signed)
The plan for patient is to withdraw medical care today and transition to comfort care. York Spaniel MSW,LCSW [phone]

## 2016-02-15 DEATH — deceased

## 2017-03-07 IMAGING — DX DG CHEST 1V PORT
2 series · 2 of 2 positions shown · non-contrast
Comparison: 02/02/2016 chest x-ray.

CLINICAL DATA: 40-year-old male with respiratory failure.
Subsequent encounter.

EXAM:
PORTABLE CHEST 1 VIEW

[chest ap (1 of 2)]
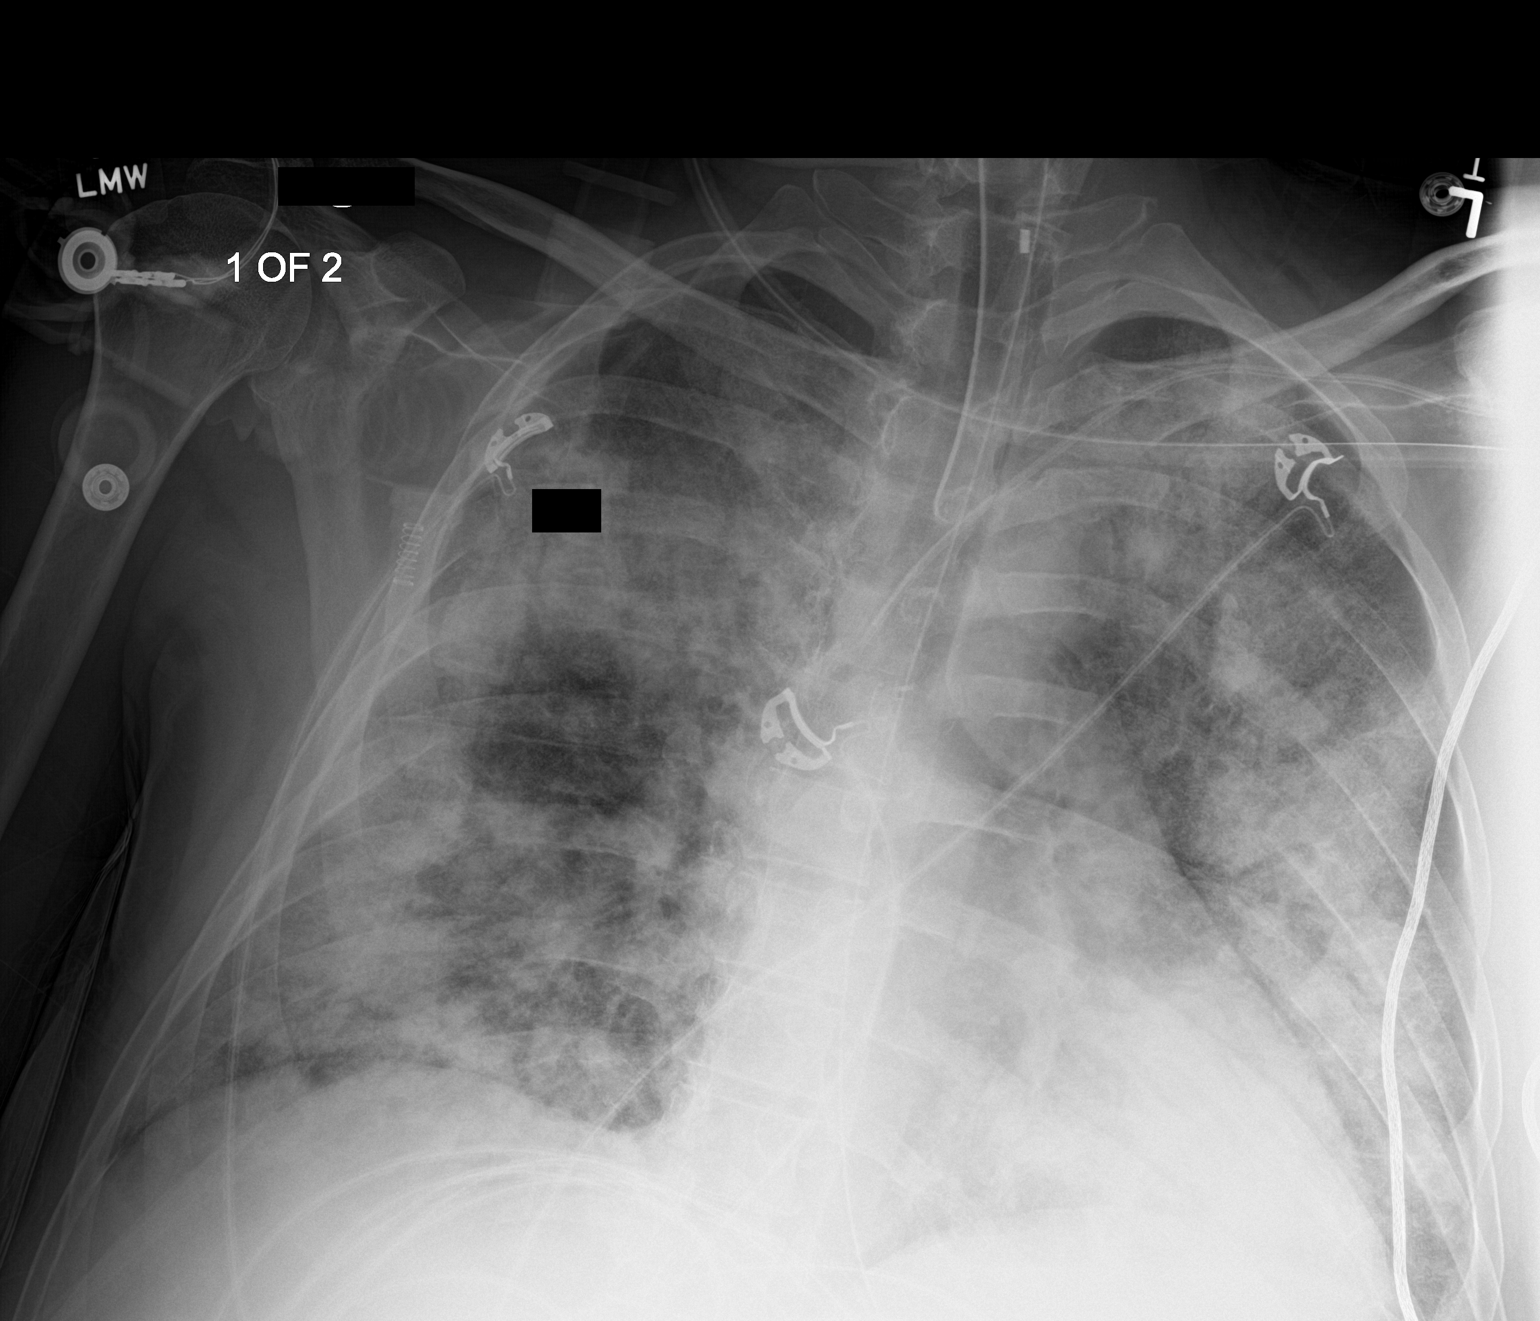

[chest ap (2 of 2)]
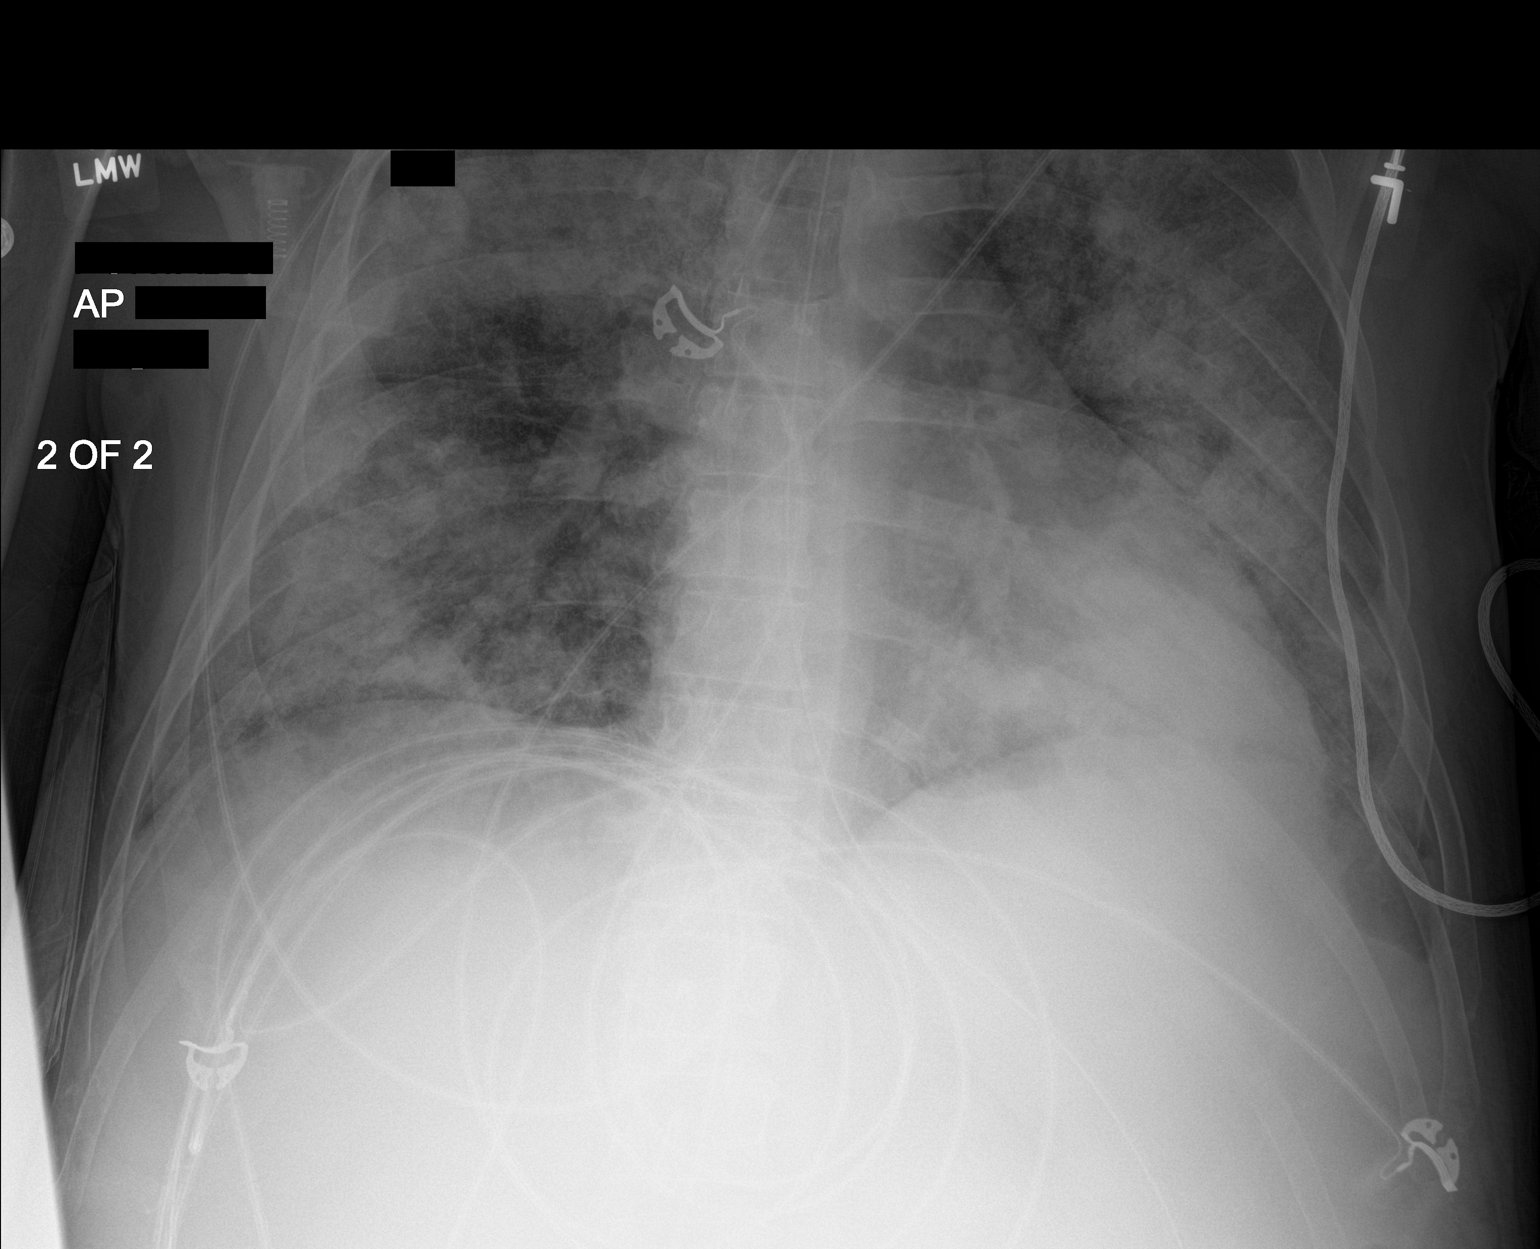

[2 of 2 positions shown; findings below may reference images not displayed]

FINDINGS: Endotracheal tube tip 5 cm above the carina. Left central line tip
proximal superior vena cava level.

Diffuse airspace disease with minimal improvement from the prior
exam. This may represent infectious multi focal infiltrates or
asymmetric pulmonary edema with ARDS pattern.

No gross pneumothorax.

Heart size within normal limits.
IMPRESSION: Diffuse airspace disease with minimal improvement since prior exam.

## 2017-03-08 IMAGING — DX DG CHEST 1V PORT
1 series · 1 of 1 positions shown · non-contrast
Comparison: 02/03/2016.

CLINICAL DATA: Respiratory failure.

EXAM:
PORTABLE CHEST 1 VIEW

[chest ap]
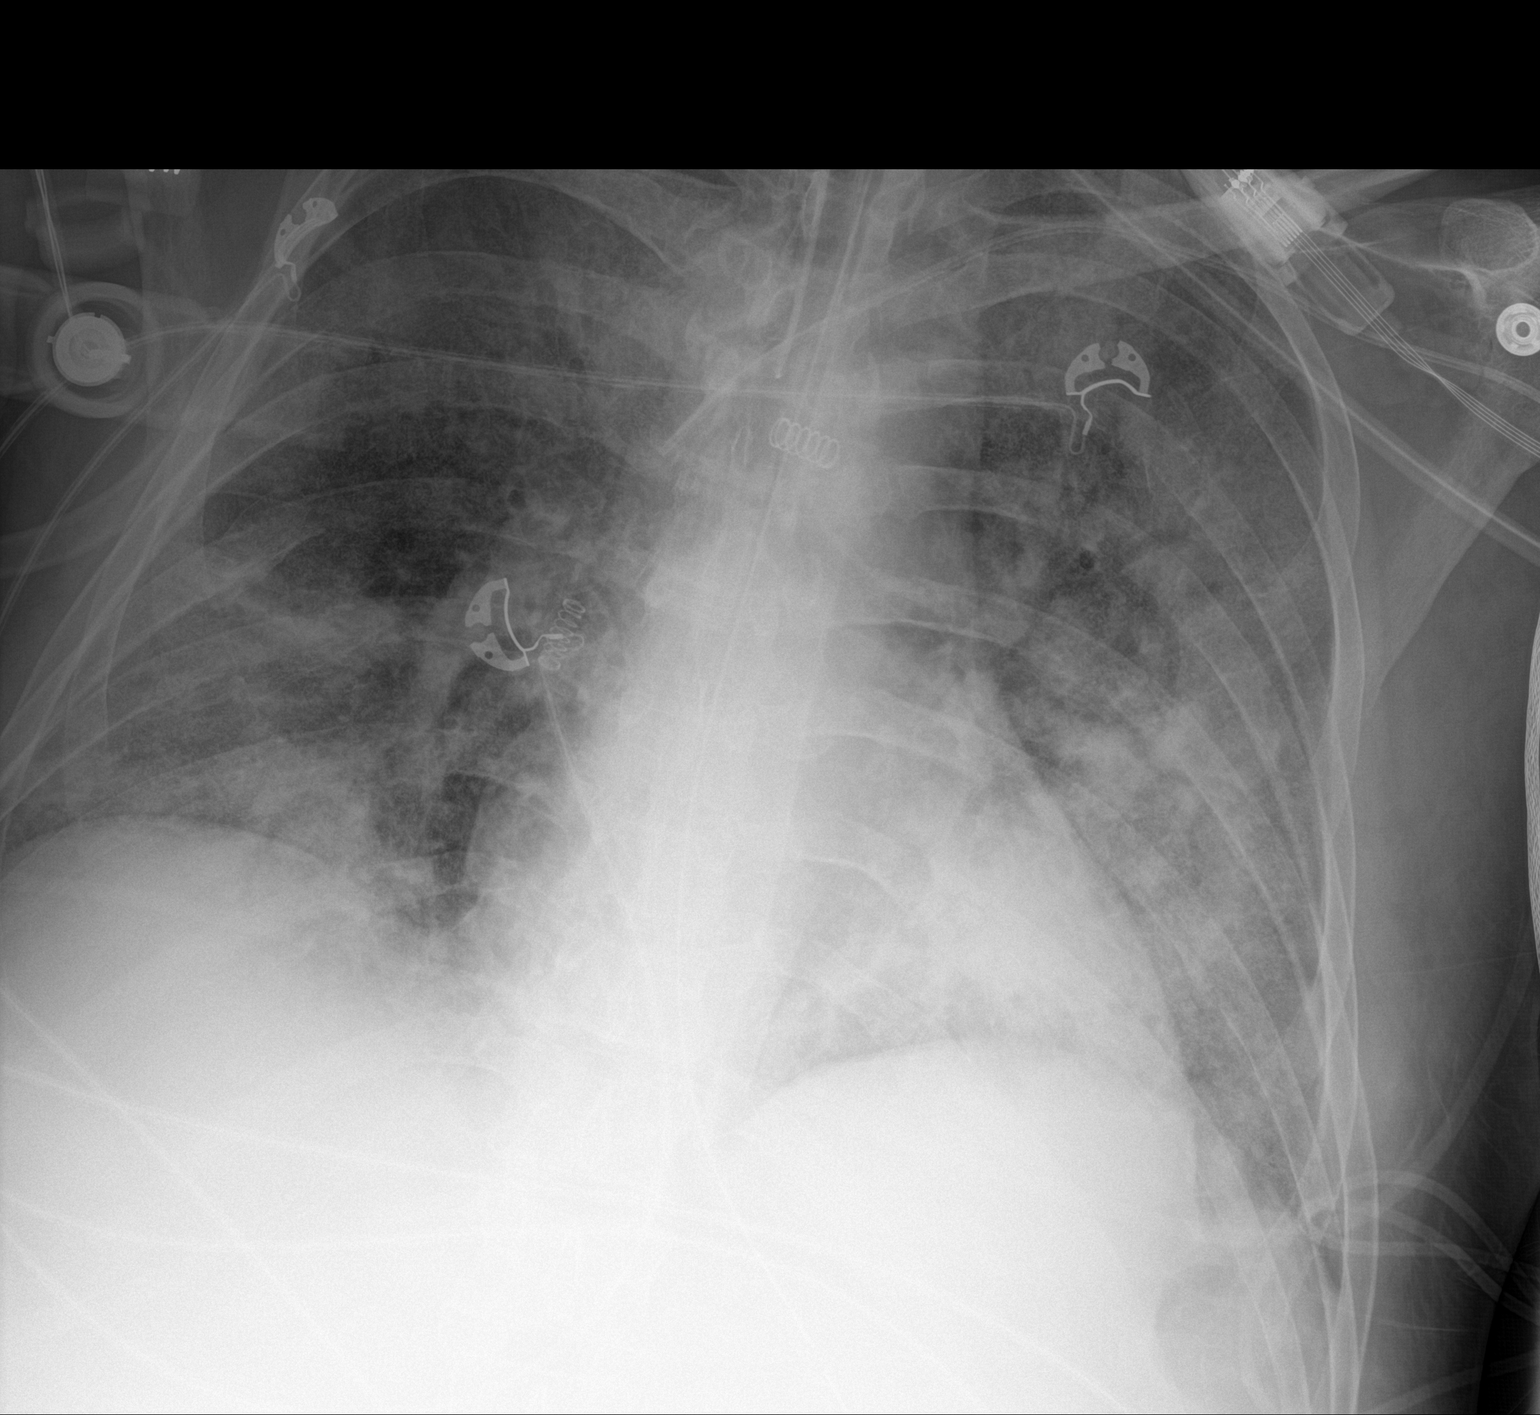

[1 of 1 positions shown; findings below may reference images not displayed]

FINDINGS: Endotracheal tube, left subclavian line, NG tube in stable position.
Stable cardiomegaly. Diffuse bilateral airspace disease with
continued slight improvement from prior exam. No pleural effusion or
pneumothorax. Right costophrenic angle not imaged.
IMPRESSION: 1. Lines and tubes in stable position.

2. Diffuse bilateral airspace disease with continued slight interim
improvement from prior exam .

## 2017-03-10 IMAGING — DX DG ABD PORTABLE 1V
1 series · 1 of 1 positions shown · non-contrast
Comparison: 02/02/2016

CLINICAL DATA: OG tube placement

EXAM:
PORTABLE ABDOMEN - 1 VIEW

[abdomen kub]
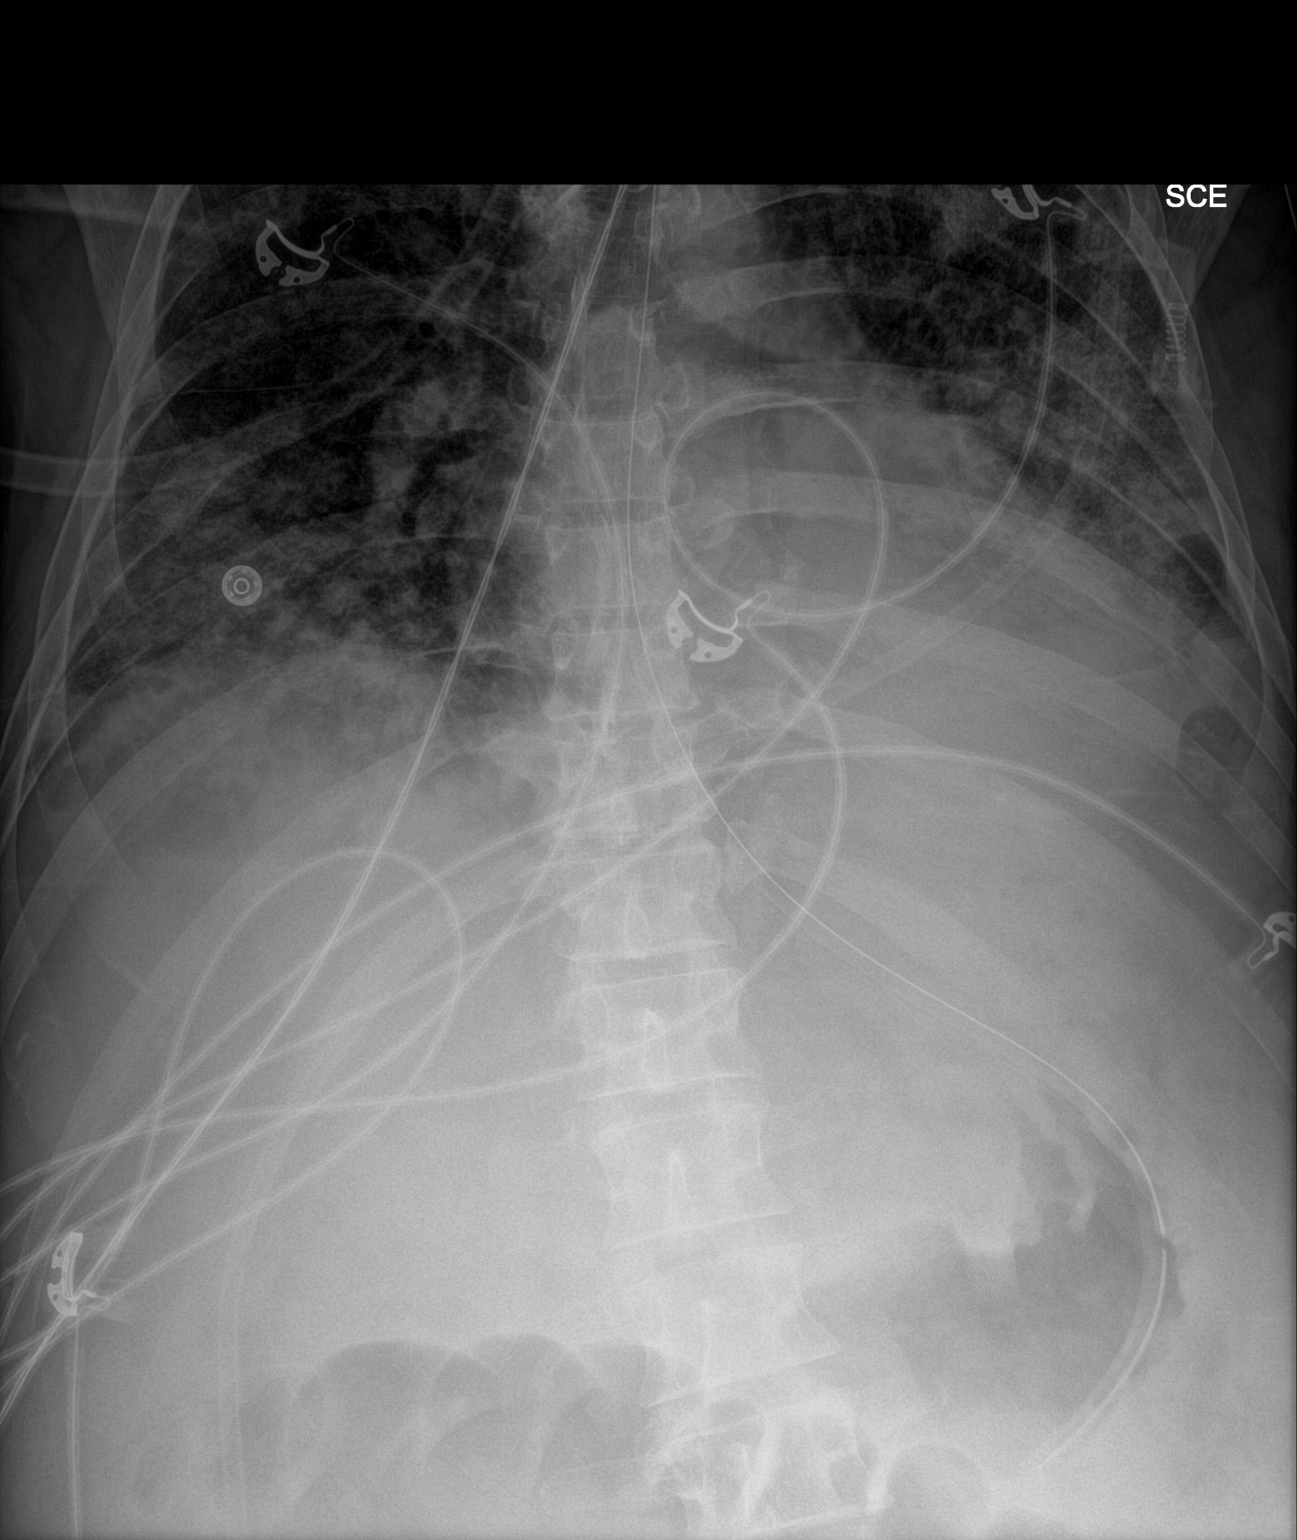

[1 of 1 positions shown; findings below may reference images not displayed]

FINDINGS: Enteric tube terminates in the mid gastric body.

Patchy bilateral lung opacities, better evaluated on recent chest
radiograph.
IMPRESSION: Enteric tube terminates in the gastric body.

## 2017-03-11 IMAGING — DX DG CHEST 1V PORT
1 series · 1 of 1 positions shown · non-contrast
Comparison: Chest radiograph 02/06/2016.

CLINICAL DATA: Patient with respiratory failure. Alcohol abuse.
Smoker.

EXAM:
PORTABLE CHEST 1 VIEW

[chest ap]
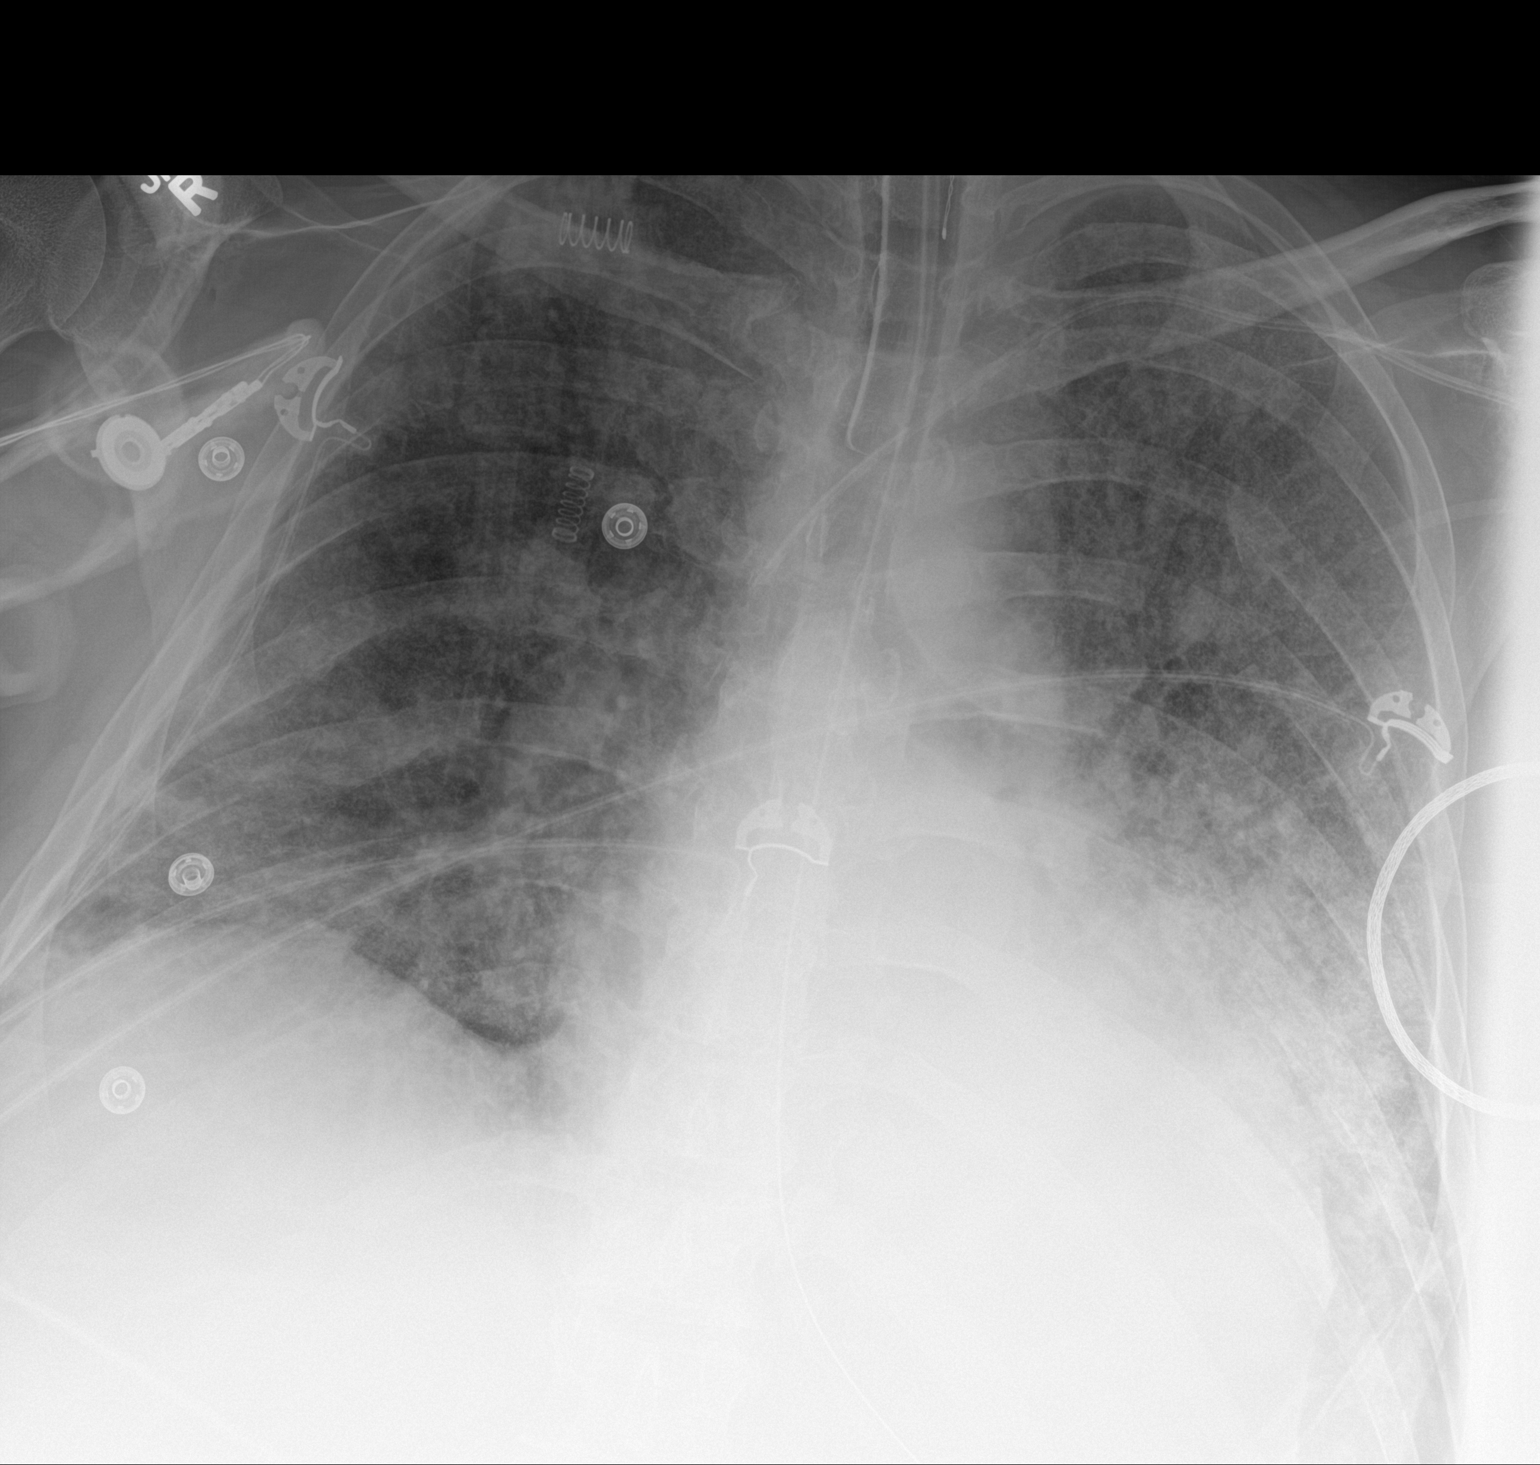

[1 of 1 positions shown; findings below may reference images not displayed]

FINDINGS: ET tube terminates in the mid trachea. Subclavian central venous
catheter tip projects over the superior vena cava. Enteric tube tip
and side-port course inferior to the diaphragm. Stable enlarged
cardiac and mediastinal contours. Grossly unchanged diffuse
bilateral left-greater-than-right heterogeneous pulmonary opacities.
Probable small bilateral pleural effusions. No pneumothorax.
IMPRESSION: Stable support apparatus.

Grossly unchanged left-greater-than-right diffuse airspace
opacities.

## 2017-03-13 IMAGING — DX DG CHEST 1V
1 series · 1 of 1 positions shown · non-contrast
Comparison: 02/09/2016 and 02/08/2016

CLINICAL DATA: History of cirrhosis no ventilator dependent with
sepsis and aspiration pneumonia. Hepatic encephalopathy alcohol
withdrawal symptoms. Metabolic encephalopathy, Coagulopathy.

EXAM:
CHEST 1 VIEW

[chest ap]
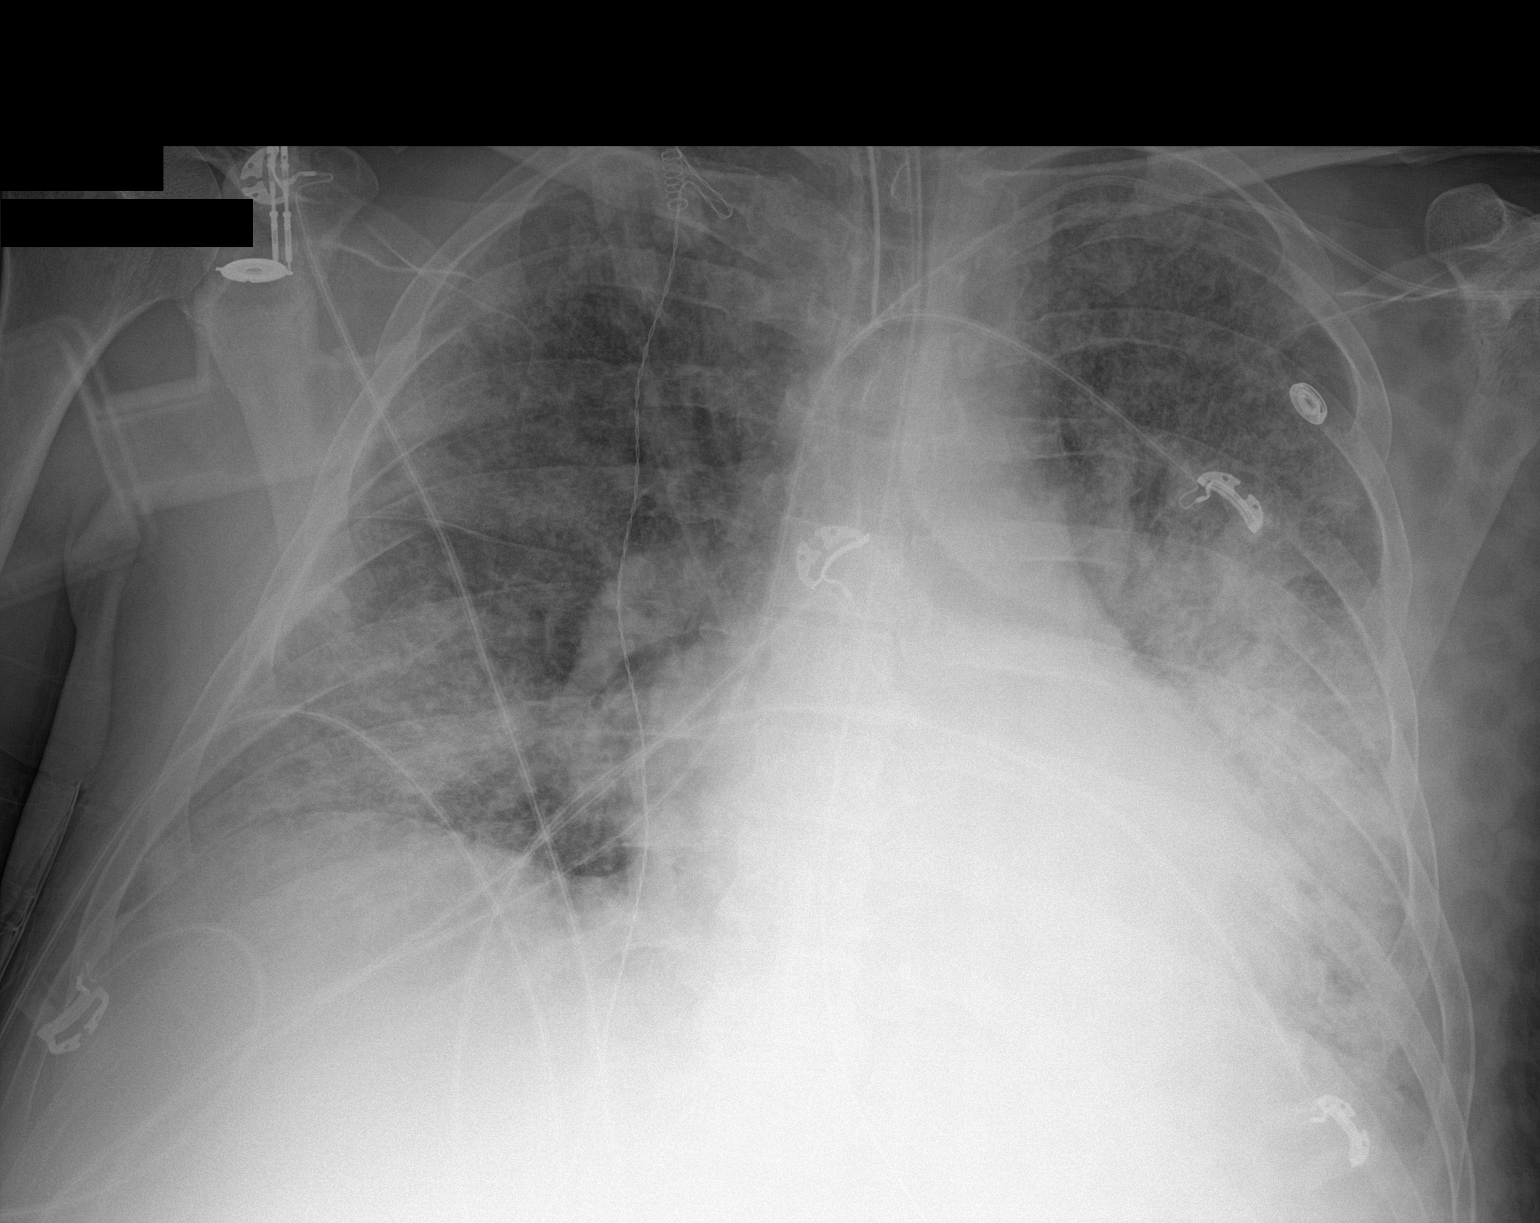

[1 of 1 positions shown; findings below may reference images not displayed]

FINDINGS: Nasogastric tube and endotracheal tubes unchanged. Left subclavian
central venous catheter unchanged.

Lungs are adequately inflated demonstrate persistent patchy hazy
opacification bilaterally worse over the left perihilar region
without significant change. Possible small amount left pleural
fluid. Mild stable cardiomegaly. Remainder of the exam is unchanged.
IMPRESSION: Hazy patchy bilateral airspace process worse over the left perihilar
region without significant change. Findings may be due to
combination of interstitial edema and infection versus ARDS.

Stable cardiomegaly.

Tubes and lines unchanged.
